# Patient Record
Sex: Male | Born: 1995 | Race: White | Hispanic: No | Marital: Single | Smoking: Former smoker
Health system: Southern US, Community
[De-identification: ages and names within clinical notes are randomized; demographics above are authoritative.]

## PROBLEM LIST (undated history)

## (undated) DIAGNOSIS — I10 Essential (primary) hypertension: Secondary | ICD-10-CM

## (undated) DIAGNOSIS — F419 Anxiety disorder, unspecified: Secondary | ICD-10-CM

## (undated) DIAGNOSIS — R55 Syncope and collapse: Secondary | ICD-10-CM

## (undated) HISTORY — PX: TONSILLECTOMY: SUR1361

---

## 2017-11-18 ENCOUNTER — Other Ambulatory Visit: Payer: Self-pay | Admitting: *Deleted

## 2017-11-18 ENCOUNTER — Ambulatory Visit
Admission: RE | Admit: 2017-11-18 | Discharge: 2017-11-18 | Disposition: A | Discharge status: Home / Self Care | Payer: No Typology Code available for payment source | Source: Ambulatory Visit | Attending: *Deleted | Admitting: *Deleted

## 2017-11-18 DIAGNOSIS — R7611 Nonspecific reaction to tuberculin skin test without active tuberculosis: Secondary | ICD-10-CM

## 2017-12-07 ENCOUNTER — Encounter: Payer: Self-pay | Admitting: Internal Medicine

## 2017-12-07 ENCOUNTER — Ambulatory Visit (INDEPENDENT_AMBULATORY_CARE_PROVIDER_SITE_OTHER): Payer: BLUE CROSS/BLUE SHIELD | Admitting: Internal Medicine

## 2017-12-07 VITALS — BP 106/67 | HR 92 | Temp 97.8°F | Wt 166.0 lb

## 2017-12-07 DIAGNOSIS — Z23 Encounter for immunization: Secondary | ICD-10-CM

## 2017-12-07 DIAGNOSIS — B182 Chronic viral hepatitis C: Secondary | ICD-10-CM | POA: Diagnosis not present

## 2017-12-07 LAB — COMPLETE METABOLIC PANEL WITH GFR
AG Ratio: 1.7 (calc) (ref 1.0–2.5)
ALT: 65 U/L — AB (ref 9–46)
AST: 38 U/L (ref 10–40)
Albumin: 4.3 g/dL (ref 3.6–5.1)
Alkaline phosphatase (APISO): 108 U/L (ref 40–115)
BUN: 14 mg/dL (ref 7–25)
CALCIUM: 10 mg/dL (ref 8.6–10.3)
CO2: 27 mmol/L (ref 20–32)
CREATININE: 0.84 mg/dL (ref 0.60–1.35)
Chloride: 104 mmol/L (ref 98–110)
GFR, EST AFRICAN AMERICAN: 144 mL/min/{1.73_m2} (ref >=60)
GFR, Est Non African American: 124 mL/min/{1.73_m2} (ref >=60)
Globulin: 2.6 g/dL (calc) (ref 1.9–3.7)
Glucose, Bld: 85 mg/dL (ref 65–99)
Potassium: 4.7 mmol/L (ref 3.5–5.3)
Sodium: 140 mmol/L (ref 135–146)
TOTAL PROTEIN: 6.9 g/dL (ref 6.1–8.1)
Total Bilirubin: 0.6 mg/dL (ref 0.2–1.2)

## 2017-12-07 NOTE — Progress Notes (Signed)
RFV: hep c treatment  Patient ID: Timothy Arnold, male   DOB: 1995-10-01, 22 y.o.   MRN: [id number]  HPI Timothy Arnold is a 21yo M who has chronic hep C without hepatic coma. He has history of heroin iv drug use but in the last year only did it a handful of times but predominantly smoked it, but has been committed to sober and no longer using. He is at a substance abuse program- 38 days of sobriety. He states that the brief time of injection drug use, he used his own needles and not thought to sharing other equipment. He also has had some homemade tattoos.  Just had a 2 wisdom extraction on upper left, taking ibuprofen for pain and finishing out course of penicillin  Previously worked in [employer] - rampant with drug use- he mentions  Labs show hep c VL 140,000 noted on 11/11/17 Outpatient Encounter Medications as of 12/07/2017  Medication Sig  . cloNIDine (CATAPRES) 0.1 MG tablet Take 0.1 mg by mouth.  Marland Kitchen rOPINIRole (REQUIP) 1 MG tablet TK 1 T PO  QD   No facility-administered encounter medications on file as of 12/07/2017.    - zoloft - seroquel - penicillin  There are no active problems to display for this patient.    Health Maintenance Due  Topic Date Due  . HIV Screening  01/30/2010  . TETANUS/TDAP  01/30/2014  . INFLUENZA VACCINE  04/14/2017    Social History   Tobacco Use  . Smoking status: Current Some Day Smoker    Packs/day: 1.00    Types: Cigarettes  . Smokeless tobacco: Never Used  Substance Use Topics  . Alcohol use: Not Currently  . Drug use: Not Currently    Types: Marijuana   Family hx = no family member with liver disease. Hx of HTN, CAD Review of Systems Review of Systems  Constitutional: Negative for fever, chills, diaphoresis, activity change, appetite change, fatigue and unexpected weight change.  HENT: Negative for congestion, sore throat, rhinorrhea, sneezing, trouble swallowing and sinus pressure.  Eyes: Negative for photophobia and visual  disturbance.  Respiratory: Negative for cough, chest tightness, shortness of breath, wheezing and stridor.  Cardiovascular: Negative for chest pain, palpitations and leg swelling.  Gastrointestinal: Negative for nausea, vomiting, abdominal pain, diarrhea, constipation, blood in stool, abdominal distention and anal bleeding.  Genitourinary: Negative for dysuria, hematuria, flank pain and difficulty urinating.  Musculoskeletal: Negative for myalgias, back pain, joint swelling, arthralgias and gait problem.  Skin: Negative for color change, pallor, rash and wound.  Neurological: Negative for dizziness, tremors, weakness and light-headedness.  Hematological: Negative for adenopathy. Does not bruise/bleed easily.  Psychiatric/Behavioral: Negative for behavioral problems, confusion, sleep disturbance, dysphoric mood, decreased concentration and agitation.    Physical Exam   BP 106/67   Pulse 92   Temp 97.8 F (36.6 C) (Oral)   Wt 166 lb (75.3 kg)   .Physical Exam  Constitutional: He is oriented to person, place, and time. He appears well-developed and well-nourished. No distress.  HENT:  Mouth/Throat: Oropharynx is clear and moist. No oropharyngeal exudate.  Cardiovascular: Normal rate, regular rhythm and normal heart sounds. Exam reveals no gallop and no friction rub.  No murmur heard.  Pulmonary/Chest: Effort normal and breath sounds normal. No respiratory distress. He has no wheezes.  Abdominal: Soft. Bowel sounds are normal. He exhibits no distension. There is no tenderness.  Lymphadenopathy:  He has no cervical adenopathy.  Neurological: He is alert and oriented to person, place, and time.  Skin:  Skin is warm and dry. No rash noted. No erythema.  Psychiatric: He has a normal mood and affect. His behavior is normal.     Assessment and Plan  Chronic hepatitis C without hepatic coma = will still need to do further testing and imaging to determine what antiviral therapy he will  need.in the meantime,   Will give hep A vaccine #1 today  Will check hep B S ab to see if he needs the vaccine series  Will check genotype, fibrosure, and RUQ u/s.  Will check hiv ab  Continue with substance abuse treatment program and counseling. Agree that moving away for crowd that used substance abuse would be helpful- in terms of harm reduction

## 2017-12-07 NOTE — Progress Notes (Signed)
HPI: Achilles DunkSteven Ramires is a 22 y.o. male who is here for his initial visit for his hep C.   No results found for: HCVGENOTYPE, HEPCGENOTYPE  Allergies: Not on File  Vitals: Temp: 97.8 F (36.6 C) (03/26 1540) Temp Source: Oral (03/26 1540) BP: 106/67 (03/26 1540) Pulse Rate: 92 (03/26 1540)  Past Medical History: No past medical history on file.  Social History: Social History   Socioeconomic History  . Marital status: Single    Spouse name: Not on file  . Number of children: Not on file  . Years of education: Not on file  . Highest education level: Not on file  Occupational History  . Not on file  Social Needs  . Financial resource strain: Not on file  . Food insecurity:    Worry: Not on file    Inability: Not on file  . Transportation needs:    Medical: Not on file    Non-medical: Not on file  Tobacco Use  . Smoking status: Current Some Day Smoker    Packs/day: 1.00    Types: Cigarettes  . Smokeless tobacco: Never Used  Substance and Sexual Activity  . Alcohol use: Not Currently  . Drug use: Not Currently    Types: Marijuana  . Sexual activity: Not on file  Lifestyle  . Physical activity:    Days per week: Not on file    Minutes per session: Not on file  . Stress: Not on file  Relationships  . Social connections:    Talks on phone: Not on file    Gets together: Not on file    Attends religious service: Not on file    Active member of club or organization: Not on file    Attends meetings of clubs or organizations: Not on file    Relationship status: Not on file  Other Topics Concern  . Not on file  Social History Narrative  . Not on file    Labs: No results found for: HIV1RNAQUANT, HIV1RNAVL, CD4TABS, HEPBSAB, HEPBSAG, HCVAB  No results found for: HCVGENOTYPE, HEPCGENOTYPE  No flowsheet data found.  No results found for: AST, ALT, INR  CrCl: CrCl cannot be calculated (No order found.).  Fibrosis Score: Pending  Child-Pugh  Score: Pending  Previous Treatment Regimen: None  Assessment: Viviann SpareSteven is here for his initial visit of his hep C. He got it from heroin use. He has been clean for about 35 days. Counseled him on the importance of adherence in order to achieve cure. He is on his stepdad's insurance but doesn't have the card today. He will email Kathie RhodesBetty with it. Counseled him on the potential choice that we have for therapy depending on his genotype.   Advised him to avoid ETOH to avoid damage to his liver. Since he is not on much, interaction is not an issue.   Recommendations:  Hep C labs today Treatment based on genotype US with elastography  Black & DeckerMinh Aidden Markovic, Pharm.D., BCPS, AAHIVP Clinical Infectious Disease Pharmacist [facility] 12/07/2017, 4:12 PM

## 2017-12-08 LAB — CBC WITH DIFFERENTIAL/PLATELET
BASOS PCT: 0.4 %
Basophils Absolute: 34 cells/uL (ref 0–200)
Eosinophils Absolute: 60 cells/uL (ref 15–500)
Eosinophils Relative: 0.7 %
HCT: 41.4 % (ref 38.5–50.0)
HEMOGLOBIN: 14.4 g/dL (ref 13.2–17.1)
Lymphs Abs: 2355 cells/uL (ref 850–3900)
MCH: 30.8 pg (ref 27.0–33.0)
MCHC: 34.8 g/dL (ref 32.0–36.0)
MCV: 88.5 fL (ref 80.0–100.0)
MONOS PCT: 9.5 %
MPV: 9.9 fL (ref 7.5–12.5)
NEUTROS ABS: 5245 {cells}/uL (ref 1500–7800)
Neutrophils Relative %: 61.7 %
PLATELETS: 342 10*3/uL (ref 140–400)
RBC: 4.68 10*6/uL (ref 4.20–5.80)
RDW: 11.5 % (ref 11.0–15.0)
TOTAL LYMPHOCYTE: 27.7 %
WBC: 8.5 10*3/uL (ref 3.8–10.8)
WBCMIX: 808 {cells}/uL (ref 200–950)

## 2017-12-08 LAB — HEPATITIS B SURFACE ANTIGEN: HEP B S AG: NONREACTIVE

## 2017-12-08 LAB — HEPATITIS A ANTIBODY, TOTAL: Hepatitis A AB,Total: NONREACTIVE

## 2017-12-08 LAB — HIV ANTIBODY (ROUTINE TESTING W REFLEX): HIV 1&2 Ab, 4th Generation: NONREACTIVE

## 2017-12-08 LAB — HEPATITIS B SURFACE ANTIBODY,QUALITATIVE: HEP B S AB: REACTIVE — AB

## 2017-12-08 LAB — PROTIME-INR
INR: 0.9
PROTHROMBIN TIME: 9.8 s (ref 9.0–11.5)

## 2017-12-10 LAB — LIVER FIBROSIS, FIBROTEST-ACTITEST
ALPHA-2-MACROGLOBULIN: 222 mg/dL (ref 106–279)
ALT: 72 U/L — AB (ref 9–46)
APOLIPOPROTEIN A1: 115 mg/dL (ref 94–176)
Bilirubin: 0.4 mg/dL (ref 0.2–1.2)
Fibrosis Score: 0.2
GGT: 51 U/L (ref 3–70)
Haptoglobin: 129 mg/dL (ref 43–212)
Necroinflammat ACT Score: 0.39
Reference ID: 2397499

## 2017-12-10 LAB — HEPATITIS C GENOTYPE

## 2018-01-11 ENCOUNTER — Telehealth: Payer: Self-pay | Admitting: *Deleted

## 2018-01-11 NOTE — Telephone Encounter (Signed)
Patient and his Case manager Laurel Dimmer called to inquire about the elastrography ordered at his visit. They advised they have called several times and gotten no response or call back. Advised scheduler is not available but will pass the message. Was advised to call Laurel Dimmer with the appt as the patient is in therapy at her facility and they will arrange to have him here. Given the ok to speak with Ms Elenore Paddy by the patient. Her contact number is below. Advised will make sure she gets the message and once it is schedule someone will give them a call. Advised we have to get approval from the insurance company.  Laurel Dimmer [phone]

## 2018-01-19 ENCOUNTER — Ambulatory Visit: Payer: Self-pay | Admitting: Infectious Disease

## 2018-01-26 ENCOUNTER — Ambulatory Visit (HOSPITAL_COMMUNITY)
Admission: RE | Admit: 2018-01-26 | Discharge: 2018-01-26 | Disposition: A | Discharge status: Home / Self Care | Payer: BLUE CROSS/BLUE SHIELD | Source: Ambulatory Visit | Attending: Internal Medicine | Admitting: Internal Medicine

## 2018-01-26 DIAGNOSIS — B182 Chronic viral hepatitis C: Secondary | ICD-10-CM | POA: Insufficient documentation

## 2018-02-03 ENCOUNTER — Telehealth: Payer: Self-pay | Admitting: Pharmacist Clinician (PhC)/ Clinical Pharmacy Specialist

## 2018-02-03 NOTE — Telephone Encounter (Signed)
All of his labs and [place] results are back. Left him a VM to come back

## 2018-08-01 ENCOUNTER — Emergency Department (HOSPITAL_COMMUNITY)
Admission: EM | Admit: 2018-08-01 | Discharge: 2018-08-01 | Disposition: A | Discharge status: Home / Self Care | Payer: BLUE CROSS/BLUE SHIELD | Attending: Emergency Medicine | Admitting: Emergency Medicine

## 2018-08-01 ENCOUNTER — Other Ambulatory Visit: Payer: Self-pay

## 2018-08-01 ENCOUNTER — Encounter (HOSPITAL_COMMUNITY): Payer: Self-pay

## 2018-08-01 DIAGNOSIS — R55 Syncope and collapse: Secondary | ICD-10-CM | POA: Diagnosis not present

## 2018-08-01 DIAGNOSIS — Z79899 Other long term (current) drug therapy: Secondary | ICD-10-CM | POA: Diagnosis not present

## 2018-08-01 DIAGNOSIS — E860 Dehydration: Secondary | ICD-10-CM | POA: Insufficient documentation

## 2018-08-01 DIAGNOSIS — R112 Nausea with vomiting, unspecified: Secondary | ICD-10-CM | POA: Diagnosis present

## 2018-08-01 DIAGNOSIS — I10 Essential (primary) hypertension: Secondary | ICD-10-CM | POA: Insufficient documentation

## 2018-08-01 DIAGNOSIS — Z87891 Personal history of nicotine dependence: Secondary | ICD-10-CM | POA: Insufficient documentation

## 2018-08-01 HISTORY — DX: Essential (primary) hypertension: I10

## 2018-08-01 LAB — COMPREHENSIVE METABOLIC PANEL
ALT: 35 U/L (ref 0–44)
ANION GAP: 8 (ref 5–15)
AST: 32 U/L (ref 15–41)
Albumin: 4.6 g/dL (ref 3.5–5.0)
Alkaline Phosphatase: 70 U/L (ref 38–126)
BUN: 13 mg/dL (ref 6–20)
CALCIUM: 9.6 mg/dL (ref 8.9–10.3)
CO2: 26 mmol/L (ref 22–32)
CREATININE: 1.09 mg/dL (ref 0.61–1.24)
Chloride: 102 mmol/L (ref 98–111)
Glucose, Bld: 95 mg/dL (ref 70–99)
Potassium: 3.6 mmol/L (ref 3.5–5.1)
SODIUM: 136 mmol/L (ref 135–145)
Total Bilirubin: 1.5 mg/dL — ABNORMAL HIGH (ref 0.3–1.2)
Total Protein: 7.1 g/dL (ref 6.5–8.1)

## 2018-08-01 LAB — CBC WITH DIFFERENTIAL/PLATELET
Abs Immature Granulocytes: 0.02 10*3/uL (ref 0.00–0.07)
BASOS PCT: 0 %
Basophils Absolute: 0 10*3/uL (ref 0.0–0.1)
EOS ABS: 0 10*3/uL (ref 0.0–0.5)
EOS PCT: 1 %
HCT: 42.8 % (ref 39.0–52.0)
Hemoglobin: 14.8 g/dL (ref 13.0–17.0)
Immature Granulocytes: 0 %
LYMPHS PCT: 33 %
Lymphs Abs: 2.4 10*3/uL (ref 0.7–4.0)
MCH: 29.5 pg (ref 26.0–34.0)
MCHC: 34.6 g/dL (ref 30.0–36.0)
MCV: 85.3 fL (ref 80.0–100.0)
Monocytes Absolute: 0.8 10*3/uL (ref 0.1–1.0)
Monocytes Relative: 11 %
NRBC: 0 % (ref 0.0–0.2)
Neutro Abs: 3.9 10*3/uL (ref 1.7–7.7)
Neutrophils Relative %: 55 %
PLATELETS: 322 10*3/uL (ref 150–400)
RBC: 5.02 MIL/uL (ref 4.22–5.81)
RDW: 11.3 % — AB (ref 11.5–15.5)
WBC: 7.2 10*3/uL (ref 4.0–10.5)

## 2018-08-01 LAB — LIPASE, BLOOD: LIPASE: 31 U/L (ref 11–51)

## 2018-08-01 LAB — TROPONIN I: Troponin I: <0.03 ng/mL (ref <0.03)

## 2018-08-01 LAB — MAGNESIUM: MAGNESIUM: 1.8 mg/dL (ref 1.7–2.4)

## 2018-08-01 MED ORDER — ONDANSETRON HCL 4 MG PO TABS: 4.0000 mg | ORAL_TABLET | Freq: Three times a day (TID) | ORAL | 0 refills | Status: DC | PRN

## 2018-08-01 MED ORDER — ONDANSETRON HCL 4 MG/2ML IJ SOLN
4.0000 mg | Freq: Once | INTRAMUSCULAR | Status: AC
  Administered 2018-08-01: 4 mg via INTRAVENOUS
  Filled 2018-08-01: qty 2

## 2018-08-01 MED ORDER — SODIUM CHLORIDE 0.9 % IV BOLUS
1000.0000 mL | Freq: Once | INTRAVENOUS | Status: AC
  Administered 2018-08-01: 1000 mL via INTRAVENOUS

## 2018-08-01 NOTE — ED Provider Notes (Signed)
[facility]   CSN: [id number] Arrival date & time: 08/01/18  1129     History   Chief Complaint Chief Complaint  Patient presents with  . Emesis    HPI Timothy Arnold is a 22 y.o. male.  He is a history of hep C untreated and a history of substance abuse.  He follows with a mental health clinic for some medications but does not have a primary care doctor.  York SpanielSaid he has been sick for about 2 months with intermittent nausea and vomiting and panic attacks that caused him to have syncopal events.  Over the past 2 weeks its gotten worse.  He feels very dehydrated.  He smokes pot but he says not very much.  He denies other drugs.  No fevers no chills no chest pain or shortness of breath.  The history is provided by the patient.  Emesis   This is a new problem. Episode onset: 2 months. The problem occurs 2 to 4 times per day. The problem has not changed since onset.The emesis has an appearance of stomach contents. There has been no fever. Pertinent negatives include no abdominal pain, no chills, no cough, no diarrhea, no fever, no sweats and no URI.    Past Medical History:  Diagnosis Date  . Hypertension     There are no active problems to display for this patient.   Past Surgical History:  Procedure Laterality Date  . TONSILLECTOMY          Home Medications    Prior to Admission medications   Medication Sig Start Date End Date Taking? Authorizing Provider  cloNIDine (CATAPRES) 0.1 MG tablet Take 0.1 mg by mouth.    [provider]  rOPINIRole (REQUIP) 1 MG tablet TK 1 T PO  QD 10/14/17   [provider]    Family History No family history on file.  Social History Social History   Tobacco Use  . Smoking status: Former Smoker    Packs/day: 1.00    Types: Cigarettes    Last attempt to quit: 01/29/2018    Years since quitting: 0.5  . Smokeless tobacco: Never Used  Substance Use Topics  . Alcohol  use: Yes    Comment: social   . Drug use: Yes    Types: Marijuana     Allergies   Patient has no known allergies.   Review of Systems Review of Systems  Constitutional: Negative for chills and fever.  HENT: Negative for sore throat.   Eyes: Negative for visual disturbance.  Respiratory: Negative for cough and shortness of breath.   Cardiovascular: Negative for chest pain.  Gastrointestinal: Positive for nausea and vomiting. Negative for abdominal pain and diarrhea.  Genitourinary: Negative for dysuria.  Musculoskeletal: Negative for neck pain.  Skin: Negative for rash.  Neurological: Positive for syncope.  Psychiatric/Behavioral: The patient is nervous/anxious.      Physical Exam Updated Vital Signs BP 116/87   Pulse 79   Temp 98.3 F (36.8 C) (Oral)   Resp 10   Ht 6\' 2"  (1.88 m)   Wt 72.6 kg   SpO2 97%   BMI 20.54 kg/m   Physical Exam  Constitutional: He is oriented to person, place, and time. He appears well-developed and well-nourished.  HENT:  Head: Normocephalic and atraumatic.  Eyes: Conjunctivae are normal.  Neck: Neck supple.  Cardiovascular: Normal rate and regular rhythm.  No murmur heard. Pulmonary/Chest: Effort normal and breath sounds normal. No respiratory  distress.  Abdominal: Soft. There is no tenderness.  Musculoskeletal: He exhibits no edema, tenderness or deformity.  Neurological: He is alert and oriented to person, place, and time. He has normal strength. No cranial nerve deficit or sensory deficit. GCS eye subscore is 4. GCS verbal subscore is 5. GCS motor subscore is 6.  Skin: Skin is warm and dry.  Psychiatric: He has a normal mood and affect.  Nursing note and vitals reviewed.    ED Treatments / Results  Labs (all labs ordered are listed, but only abnormal results are displayed) Labs Reviewed  COMPREHENSIVE METABOLIC PANEL - Abnormal; Notable for the following components:      Result Value   Total Bilirubin 1.5 (*)    All other  components within normal limits  CBC WITH DIFFERENTIAL/PLATELET - Abnormal; Notable for the following components:   RDW 11.3 (*)    All other components within normal limits  LIPASE, BLOOD  MAGNESIUM  TROPONIN I    EKG EKG Interpretation  Date/Time:  Monday August 01 2018 12:26:04 EST Ventricular Rate:  72 PR Interval:    QRS Duration: 100 QT Interval:  381 QTC Calculation: 417 R Axis:   93 Text Interpretation:  Sinus rhythm LAE, consider biatrial enlargement Borderline right axis deviation RSR' in V1 or V2, probably normal variant ST elevation suggests acute pericarditis no prior to compare with Confirmed by Meridee Score [phone]) on 08/01/2018 12:28:51 PM Also confirmed by Meridee Score [phone]), editor Sheppard Evens ([id number])  on 08/01/2018 4:19:19 PM   Radiology No results found.  Procedures Procedures (including critical care time)  Medications Ordered in ED Medications  sodium chloride 0.9 % bolus 1,000 mL (has no administration in time range)  ondansetron (ZOFRAN) injection 4 mg (has no administration in time range)     Initial Impression / Assessment and Plan / ED Course  I have reviewed the triage vital signs and the nursing notes.  Pertinent labs & imaging results that were available during my care of the patient were reviewed by me and considered in my medical decision making (see chart for details).   labs unremarkable. Will treat with zofran, have followup with pcp clinic for further workup.   Final Clinical Impressions(s) / ED Diagnoses   Final diagnoses:  Dehydration  Syncope, unspecified syncope type    ED Discharge Orders         Ordered    ondansetron (ZOFRAN) 4 MG tablet  Every 8 hours PRN     08/01/18 1511           Terrilee Files, MD 08/01/18 Rickey Primus

## 2018-08-01 NOTE — ED Notes (Signed)
Patient verbalizes understanding of discharge instructions. Opportunity for questioning and answers were provided. Armband removed by staff, pt discharged from ED ambulatory.   

## 2018-08-01 NOTE — ED Triage Notes (Signed)
Pt states that he has had NV and panic attacks over the past 2 months, and fainting with standing, reports weight loss.

## 2018-08-01 NOTE — Discharge Instructions (Addendum)
You were evaluated in the emergency department for nausea vomiting and fainting spells.  You had blood work and an EKG that did not show an obvious cause of your symptoms.  We are prescribing some nausea medication which may help you keep better hydrated.  Will be important for you to get a primary care doctor so you can have a further work-up of this.  Please return if any worsening symptoms.

## 2018-10-21 ENCOUNTER — Telehealth: Payer: Self-pay | Admitting: Pharmacist

## 2018-10-21 NOTE — Telephone Encounter (Signed)
Patient saw Dr. Drue Second back in March 2019 for his chronic Hepatitis C infection. I called him today to see if he was still interested in treatment and he is.  Made him a f/u appointment with me for 2/17 at 4pm so he can come in and get updated labs and discuss treatment.

## 2018-10-31 ENCOUNTER — Ambulatory Visit: Payer: BLUE CROSS/BLUE SHIELD | Admitting: Pharmacist

## 2018-11-01 NOTE — Telephone Encounter (Signed)
Patient no showed appointment yesterday to discuss Hepatitis C. Willing to reschedule and discuss with him if he calls and is interested.

## 2019-05-30 ENCOUNTER — Other Ambulatory Visit: Payer: Self-pay

## 2019-05-30 ENCOUNTER — Emergency Department (HOSPITAL_COMMUNITY)
Admission: EM | Admit: 2019-05-30 | Discharge: 2019-05-30 | Disposition: A | Discharge status: Home / Self Care | Payer: BC Managed Care – PPO | Attending: Emergency Medicine | Admitting: Emergency Medicine

## 2019-05-30 ENCOUNTER — Encounter (HOSPITAL_COMMUNITY): Payer: Self-pay | Admitting: *Deleted

## 2019-05-30 DIAGNOSIS — Z87891 Personal history of nicotine dependence: Secondary | ICD-10-CM | POA: Insufficient documentation

## 2019-05-30 DIAGNOSIS — K5900 Constipation, unspecified: Secondary | ICD-10-CM | POA: Insufficient documentation

## 2019-05-30 NOTE — Discharge Instructions (Addendum)
Please read attached information. If you experience any new or worsening signs or symptoms please return to the emergency room for evaluation. Please follow-up with your primary care provider or specialist as discussed.  Please use MiraLAX as directed.  °

## 2019-05-30 NOTE — ED Triage Notes (Signed)
Pt in c/o chronic constipation with the hx of the same, pt reports LBM x 4 days ago, pt states, "I stuck my finger up there and it is too big to come out. I have had to do it before and it will come out and bleed a little, but I can't get it out now. I am pretty sure it is because of the medicine I take." pt denies n/v/d, pt c/o rectal pressure and fullness, A&O x4

## 2019-05-30 NOTE — ED Provider Notes (Signed)
[facility]   CSN: [id number] Arrival date & time: 05/30/19  1356     History   Chief Complaint Chief Complaint  Patient presents with  . Constipation    HPI Achilles DunkSteven Pogorzelski is a 23 y.o. male.     HPI    23 year old male presents today with complaints of constipation.  Patient notes a history of chronic constipation that is intermittent.  Usually he is able to manage this at home.  He notes he feels like he has a large burden of stool in his rectum, he notes he was able to get some of it out with his finger but still feels fairly constipated.  Patient notes he went to urgent care and was referred to the emergency room.  He notes he is used numerous therapies at home including stool softener and laxatives with no significant improvement in symptoms.  No history of abdominal surgery.  He denies any fever.  Patient notes he takes Suboxone which contributes to his constipation.    Past Medical History:  Diagnosis Date  . Hypertension     There are no active problems to display for this patient.   Past Surgical History:  Procedure Laterality Date  . TONSILLECTOMY          Home Medications    Prior to Admission medications   Medication Sig Start Date End Date Taking? Authorizing Provider  cloNIDine (CATAPRES) 0.1 MG tablet Take 0.1 mg by mouth.    [provider]  ondansetron (ZOFRAN) 4 MG tablet Take 1 tablet (4 mg total) by mouth every 8 (eight) hours as needed for nausea or vomiting. 08/01/18   Terrilee FilesButler, Michael C, MD  rOPINIRole (REQUIP) 1 MG tablet TK 1 T PO  QD 10/14/17   [provider]    Family History No family history on file.  Social History Social History   Tobacco Use  . Smoking status: Former Smoker    Packs/day: 1.00    Types: Cigarettes    Quit date: 01/29/2018    Years since quitting: 1.3  . Smokeless tobacco: Never Used  Substance Use Topics  . Alcohol use: Yes   Comment: social   . Drug use: Yes    Types: Marijuana     Allergies   Patient has no known allergies.   Review of Systems Review of Systems  All other systems reviewed and are negative.    Physical Exam Updated Vital Signs BP 100/68   Pulse 69   Temp 98.3 F (36.8 C) (Oral)   Resp 16   Ht 6\' 3"  (1.905 m)   Wt 68 kg   SpO2 99%   BMI 18.75 kg/m   Physical Exam Vitals signs and nursing note reviewed.  Constitutional:      Appearance: He is well-developed.  HENT:     Head: Normocephalic and atraumatic.  Eyes:     General: No scleral icterus.       Right eye: No discharge.        Left eye: No discharge.     Conjunctiva/sclera: Conjunctivae normal.     Pupils: Pupils are equal, round, and reactive to light.  Neck:     Musculoskeletal: Normal range of motion.     Vascular: No JVD.     Trachea: No tracheal deviation.  Pulmonary:     Effort: Pulmonary effort is normal.     Breath sounds: No stridor.  Abdominal:     General: There is  no distension.     Palpations: Abdomen is soft.     Tenderness: There is no abdominal tenderness.  Genitourinary:    Comments: Large burden of hard stool noted in the rectum Neurological:     Mental Status: He is alert and oriented to person, place, and time.     Coordination: Coordination normal.  Psychiatric:        Behavior: Behavior normal.        Thought Content: Thought content normal.        Judgment: Judgment normal.      ED Treatments / Results  Labs (all labs ordered are listed, but only abnormal results are displayed) Labs Reviewed - No data to display  EKG None  Radiology No results found.  Procedures Procedures (including critical care time)  Medications Ordered in ED Medications - No data to display   Initial Impression / Assessment and Plan / ED Course  I have reviewed the triage vital signs and the nursing notes.  Pertinent labs & imaging results that were available during my care of the patient  were reviewed by me and considered in my medical decision making (see chart for details).        Labs:   Imaging:  Consults:  Therapeutics:  Discharge Meds:   Assessment/Plan:   23 year old male presents today with constipation.  He was manually disimpacted here, he was able to have a normal bowel movement subsequently.  He has no abdominal tenderness to palpation, is well-appearing no acute distress.  Discharged with instructions to use MiraLAX high-fiber diet drink plenty fluids and return if he develops any concerning signs or symptoms.  He verbalized understanding and agreement to today's plan.     Final Clinical Impressions(s) / ED Diagnoses   Final diagnoses:  Constipation, unspecified constipation type    ED Discharge Orders    None       Francee Gentile 05/31/19 1033    Quintella Reichert, MD 05/31/19 1127

## 2019-07-18 IMAGING — US US ABDOMEN COMPLETE W/ ELASTOGRAPHY
1 series · 13 of 13 positions shown · non-contrast
Comparison: None

CLINICAL DATA: Chronic hepatitis-C without hepatic coma



[Series 1: us abdomen complete w/ elastography · 0.14mm/px · 13 of 13 slices shown]
[im 1/13]
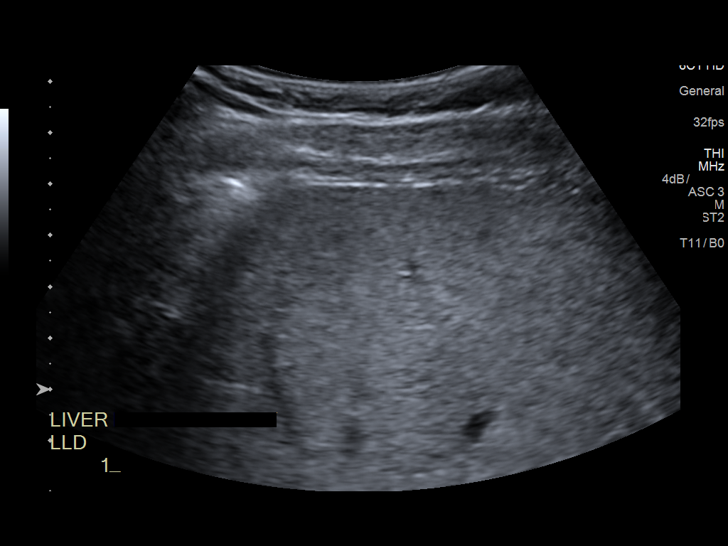
[im 2/13]
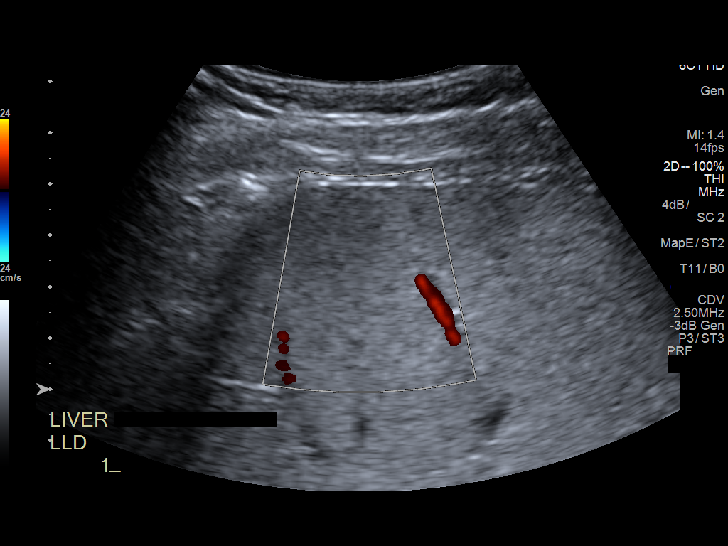
[im 3/13]
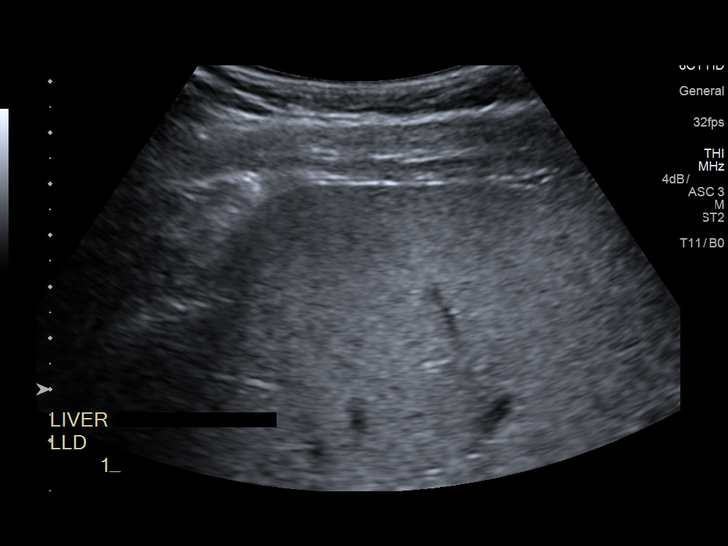
[im 4/13]
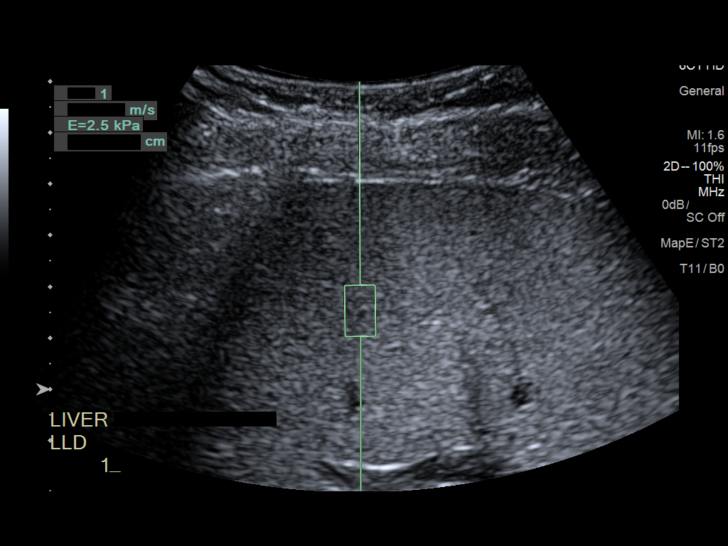
[im 5/13]
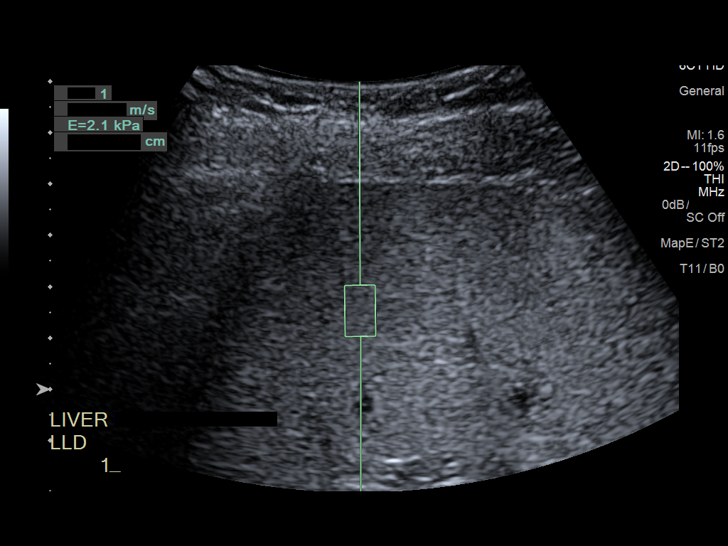
[im 6/13]
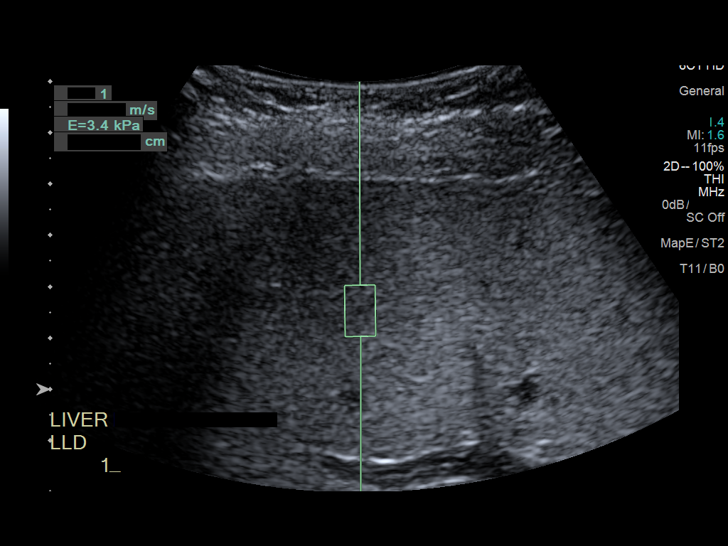
[im 7/13]
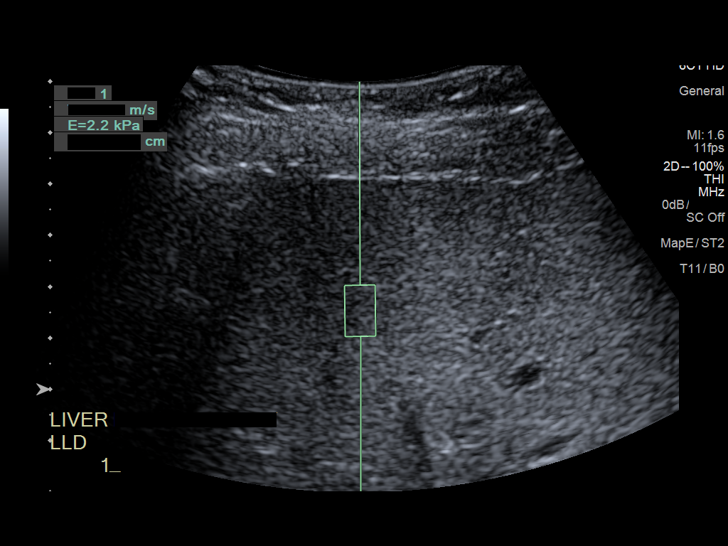
[im 8/13]
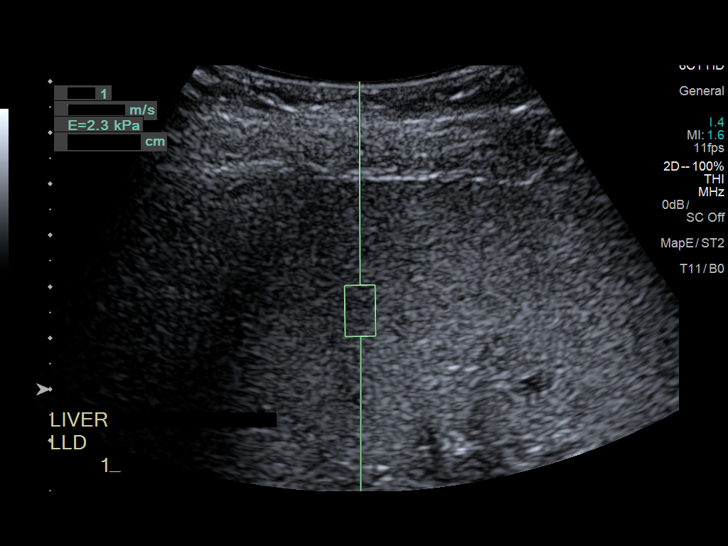
[im 9/13]
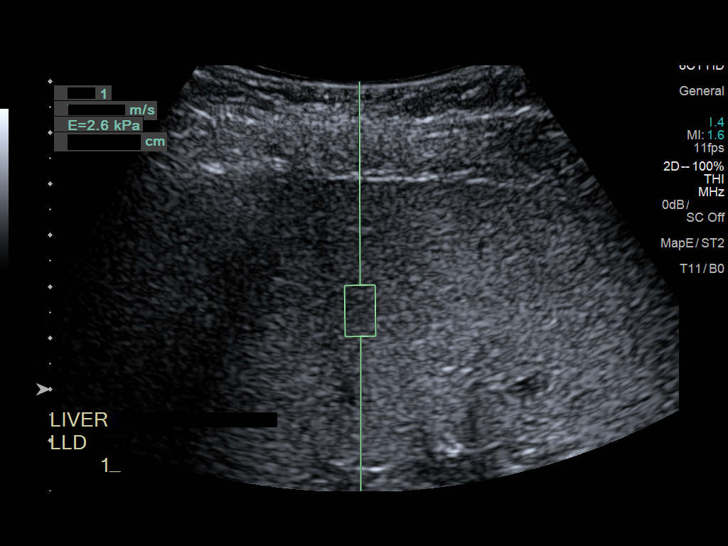
[im 10/13]
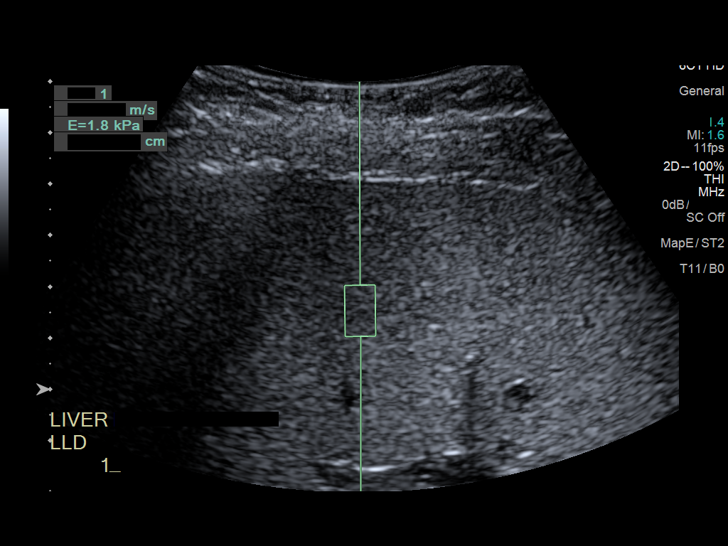
[im 11/13]
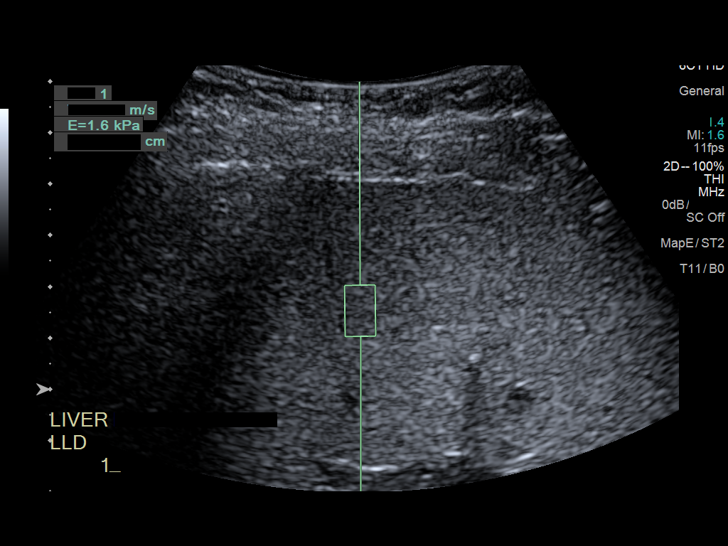
[im 12/13]
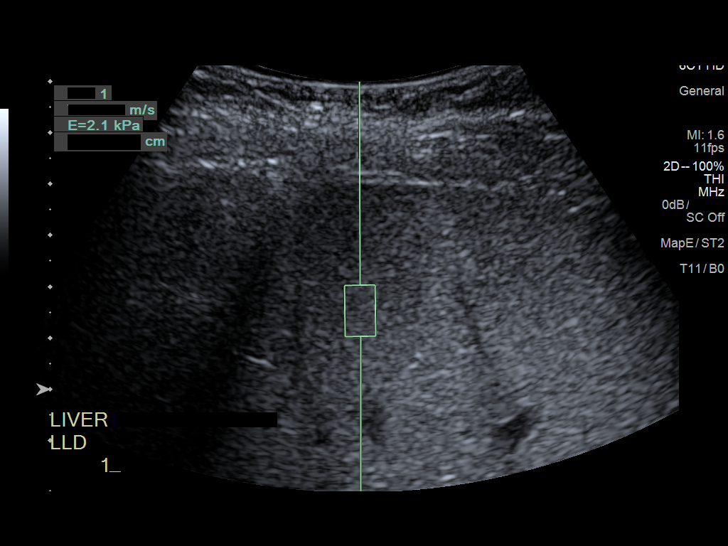
[im 13/13]
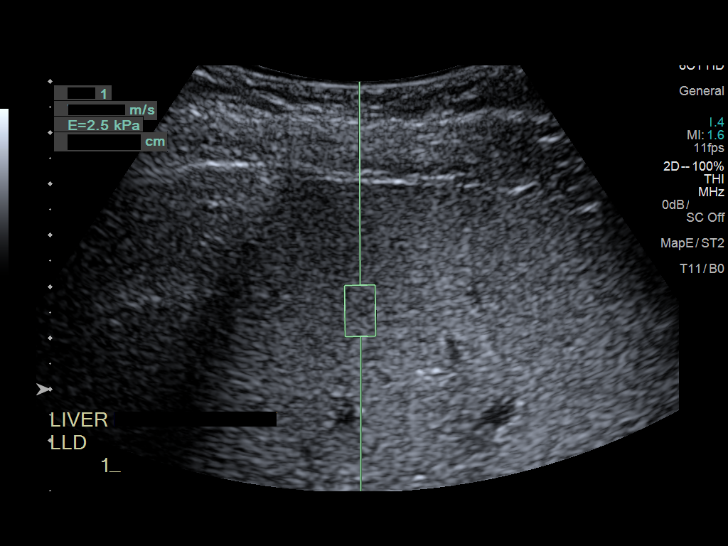

[13 of 13 positions shown; findings below may reference images not displayed]

FINDINGS: ULTRASOUND ABDOMEN

Gallbladder: Normally distended without stones or wall thickening.
No pericholecystic fluid or sonographic Murphy sign.

Common bile duct: Diameter: 6 mm diameter, upper normal

Liver: Borderline increased echogenicity. No focal hepatic mass or
nodularity. Portal vein is patent on color Doppler imaging with
normal direction of blood flow towards the liver.

IVC: Normal appearance

Pancreas: Normal appearance

Spleen: Normal appearance, 10.0 cm length

Right Kidney: Length: 10.1 cm. Question 3 mm nonobstructing mid
RIGHT renal calculus. No hydronephrosis or renal mass.

Left Kidney: Length: 11.2 cm. Normal morphology without mass or
hydronephrosis.

Abdominal aorta: Normal caliber

Other findings: No free fluid

ULTRASOUND HEPATIC ELASTOGRAPHY

Device: Siemens Helix VTQ

Patient position: Left Lateral Decubitus

Transducer 6C1

Number of measurements: 10

Hepatic segment:  8

Median velocity:   0.87 m/sec

IQR:

IQR/Median velocity ratio:

Corresponding Metavir fibrosis score:  F0/F1

Risk of fibrosis: Low

Limitations of exam: None

Please note that abnormal shear wave velocities may also be
identified in clinical settings other than with hepatic fibrosis,
such as: acute hepatitis, elevated right heart and central venous
pressures including use of beta blockers, Viray disease
([place]), infiltrative processes such as
mastocytosis/amyloidosis/infiltrative tumor, extrahepatic
cholestasis, in the post-prandial state, and liver transplantation.
Correlation with patient history, laboratory data, and clinical
condition recommended.
IMPRESSION: ULTRASOUND ABDOMEN:
Questionable tiny nonobstructing RIGHT renal calculus.

No other definite upper abdominal sonographic abnormalities.

ULTRASOUND HEPATIC ELASTOGRAPHY:

Median hepatic shear wave velocity is calculated at 0.87 m/sec.

Corresponding Metavir fibrosis score is F0/F1.

Risk of fibrosis is low.

Follow-up: None required

## 2020-02-12 ENCOUNTER — Emergency Department (HOSPITAL_COMMUNITY)
Admission: EM | Admit: 2020-02-12 | Discharge: 2020-02-12 | Disposition: A | Discharge status: Home / Self Care | Payer: BC Managed Care – PPO | Attending: Emergency Medicine | Admitting: Emergency Medicine

## 2020-02-12 ENCOUNTER — Other Ambulatory Visit: Payer: Self-pay

## 2020-02-12 DIAGNOSIS — K0889 Other specified disorders of teeth and supporting structures: Secondary | ICD-10-CM | POA: Diagnosis not present

## 2020-02-12 DIAGNOSIS — I10 Essential (primary) hypertension: Secondary | ICD-10-CM | POA: Insufficient documentation

## 2020-02-12 DIAGNOSIS — Z87891 Personal history of nicotine dependence: Secondary | ICD-10-CM | POA: Insufficient documentation

## 2020-02-12 DIAGNOSIS — Z79899 Other long term (current) drug therapy: Secondary | ICD-10-CM | POA: Diagnosis not present

## 2020-02-12 MED ORDER — KETOROLAC TROMETHAMINE 30 MG/ML IJ SOLN
30.0000 mg | Freq: Once | INTRAMUSCULAR | Status: AC
  Administered 2020-02-12: 30 mg via INTRAMUSCULAR
  Filled 2020-02-12: qty 1

## 2020-02-12 NOTE — Discharge Instructions (Signed)
Follow up with your dentist tomorrow for further management of your recurrent dental pain

## 2020-02-12 NOTE — ED Triage Notes (Signed)
Patient reports he was seen about a week and a half ago for dental pain and was given a "pain shot" that has gotten him to today. Patient reports he has an apt with the dentist tomorrow and just needs another shot to get him through to his apt.

## 2020-02-12 NOTE — ED Provider Notes (Signed)
[facility]-EMERGENCY DEPT Provider Note   CSN: [id number] Arrival date & time: 02/12/20  1235     History Chief Complaint  Patient presents with  . Dental Pain    Timothy Arnold is a 24 y.o. male.  The history is provided by the patient and medical records. No language interpreter was used.     24 year old male presenting for evaluation of dental pain.  Patient report for the past 2 months he has had recurrent dental pain.  Pain is involving the molars on both sides.  Pain primarily in the lower molars.  Pain is sharp throbbing achy with chewing.  Denies heat or cold sensitivity.  No fever, no trouble swallowing, no no neck pain chest pain or trouble breathing.  He has been seen for his dental pain recently and was prescribed amoxicillin that he recently finished.  He takes ibuprofen for pain without adequate relief.  He does have an appointment with a dentist tomorrow but he is here today requesting for immediate pain control.  Past Medical History:  Diagnosis Date  . Hypertension     There are no problems to display for this patient.   Past Surgical History:  Procedure Laterality Date  . TONSILLECTOMY         No family history on file.  Social History   Tobacco Use  . Smoking status: Former Smoker    Packs/day: 1.00    Types: Cigarettes    Quit date: 01/29/2018    Years since quitting: 2.0  . Smokeless tobacco: Never Used  Substance Use Topics  . Alcohol use: Yes    Comment: social   . Drug use: Yes    Types: Marijuana    Home Medications Prior to Admission medications   Medication Sig Start Date End Date Taking? Authorizing Provider  cloNIDine (CATAPRES) 0.1 MG tablet Take 0.1 mg by mouth.    [provider]  ondansetron (ZOFRAN) 4 MG tablet Take 1 tablet (4 mg total) by mouth every 8 (eight) hours as needed for nausea or vomiting. 08/01/18   Terrilee Files, MD  rOPINIRole (REQUIP) 1 MG tablet TK 1 T PO  QD 10/14/17    [provider]    Allergies    Patient has no known allergies.  Review of Systems   Review of Systems  Constitutional: Negative for fever.  HENT: Positive for dental problem.     Physical Exam Updated Vital Signs There were no vitals taken for this visit.  Physical Exam Vitals and nursing note reviewed.  Constitutional:      General: He is not in acute distress.    Appearance: He is well-developed.  HENT:     Head: Atraumatic.     Mouth/Throat:     Comments: Decay noted to tooth #32 as well as tooth 17 and 18.  Several amalgam fillings noted to several other teeth.  Tenderness to palpation to affected teeth but no obvious abscess appreciated.  No malocclusion.  No signs of mastoiditis.  Ear exam unremarkable. Eyes:     Conjunctiva/sclera: Conjunctivae normal.  Musculoskeletal:     Cervical back: Neck supple.  Skin:    Findings: No rash.  Neurological:     Mental Status: He is alert.     ED Results / Procedures / Treatments   Labs (all labs ordered are listed, but only abnormal results are displayed) Labs Reviewed - No data to display  EKG None  Radiology No results found.  Procedures Procedures (including  critical care time)  Medications Ordered in ED Medications - No data to display  ED Course  I have reviewed the triage vital signs and the nursing notes.  Pertinent labs & imaging results that were available during my care of the patient were reviewed by me and considered in my medical decision making (see chart for details).    MDM Rules/Calculators/A&P                      BP (!) 129/98 (BP Location: Right Arm)   Pulse 68   Temp 98.8 F (37.1 C) (Oral)   Resp 18   SpO2 100%   Final Clinical Impression(s) / ED Diagnoses Final diagnoses:  Pain, dental    Rx / DC Orders ED Discharge Orders    None     1:27 PM Patient here with recurrent dental pain for the past 2 months.  Does have a dental appointment tomorrow.  Here  requesting for pain control.  He is currently on amoxicillin.  No obvious signs of dental abscess appreciated on exam.  Low suspicion for malocclusion, mastoiditis, or other deep tissue infection.  Will provide Toradol IM injection and encourage patient to follow-up with dentist tomorrow for further care.   Domenic Moras, PA-C 02/12/20 1455    Maudie Flakes, MD 02/19/20 1255

## 2023-01-09 ENCOUNTER — Emergency Department (HOSPITAL_COMMUNITY): Payer: BC Managed Care – PPO

## 2023-01-09 ENCOUNTER — Other Ambulatory Visit: Payer: Self-pay

## 2023-01-09 ENCOUNTER — Encounter (HOSPITAL_COMMUNITY): Payer: Self-pay

## 2023-01-09 ENCOUNTER — Emergency Department (HOSPITAL_COMMUNITY)
Admission: EM | Admit: 2023-01-09 | Discharge: 2023-01-09 | Disposition: A | Discharge status: Home / Self Care | Payer: BC Managed Care – PPO | Attending: Emergency Medicine | Admitting: Emergency Medicine

## 2023-01-09 DIAGNOSIS — F419 Anxiety disorder, unspecified: Secondary | ICD-10-CM | POA: Insufficient documentation

## 2023-01-09 DIAGNOSIS — R079 Chest pain, unspecified: Secondary | ICD-10-CM | POA: Insufficient documentation

## 2023-01-09 DIAGNOSIS — R002 Palpitations: Secondary | ICD-10-CM

## 2023-01-09 HISTORY — DX: Syncope and collapse: R55

## 2023-01-09 LAB — CK: Total CK: 72 U/L (ref 49–397)

## 2023-01-09 LAB — RAPID URINE DRUG SCREEN, HOSP PERFORMED
Amphetamines: NOT DETECTED
Barbiturates: NOT DETECTED
Benzodiazepines: NOT DETECTED
Cocaine: NOT DETECTED
Opiates: NOT DETECTED
Tetrahydrocannabinol: POSITIVE — AB

## 2023-01-09 LAB — URINALYSIS, ROUTINE W REFLEX MICROSCOPIC
Bilirubin Urine: NEGATIVE
Glucose, UA: NEGATIVE mg/dL
Hgb urine dipstick: NEGATIVE
Ketones, ur: NEGATIVE mg/dL
Leukocytes,Ua: NEGATIVE
Nitrite: NEGATIVE
Protein, ur: NEGATIVE mg/dL
Specific Gravity, Urine: 1.006 (ref 1.005–1.030)
pH: 6 (ref 5.0–8.0)

## 2023-01-09 LAB — HEPATIC FUNCTION PANEL
ALT: 45 U/L — ABNORMAL HIGH (ref 0–44)
AST: 31 U/L (ref 15–41)
Albumin: 3.8 g/dL (ref 3.5–5.0)
Alkaline Phosphatase: 43 U/L (ref 38–126)
Bilirubin, Direct: 0.2 mg/dL (ref 0.0–0.2)
Indirect Bilirubin: 1.3 mg/dL — ABNORMAL HIGH (ref 0.3–0.9)
Total Bilirubin: 1.5 mg/dL — ABNORMAL HIGH (ref 0.3–1.2)
Total Protein: 6.4 g/dL — ABNORMAL LOW (ref 6.5–8.1)

## 2023-01-09 LAB — BASIC METABOLIC PANEL
Anion gap: 7 (ref 5–15)
BUN: 9 mg/dL (ref 6–20)
CO2: 24 mmol/L (ref 22–32)
Calcium: 8.5 mg/dL — ABNORMAL LOW (ref 8.9–10.3)
Chloride: 109 mmol/L (ref 98–111)
Creatinine, Ser: 0.78 mg/dL (ref 0.61–1.24)
GFR, Estimated: >60 mL/min (ref >60)
Glucose, Bld: 100 mg/dL — ABNORMAL HIGH (ref 70–99)
Potassium: 4.1 mmol/L (ref 3.5–5.1)
Sodium: 140 mmol/L (ref 135–145)

## 2023-01-09 LAB — CBC
HCT: 38.2 % — ABNORMAL LOW (ref 39.0–52.0)
Hemoglobin: 13.1 g/dL (ref 13.0–17.0)
MCH: 30.3 pg (ref 26.0–34.0)
MCHC: 34.3 g/dL (ref 30.0–36.0)
MCV: 88.2 fL (ref 80.0–100.0)
Platelets: 229 10*3/uL (ref 150–400)
RBC: 4.33 MIL/uL (ref 4.22–5.81)
RDW: 11.5 % (ref 11.5–15.5)
WBC: 5.6 10*3/uL (ref 4.0–10.5)
nRBC: 0 % (ref 0.0–0.2)

## 2023-01-09 LAB — TSH: TSH: 0.661 u[IU]/mL (ref 0.350–4.500)

## 2023-01-09 LAB — LIPASE, BLOOD: Lipase: 72 U/L — ABNORMAL HIGH (ref 11–51)

## 2023-01-09 LAB — TROPONIN I (HIGH SENSITIVITY)
Troponin I (High Sensitivity): 8 ng/L (ref <18)
Troponin I (High Sensitivity): 7 ng/L (ref <18)

## 2023-01-09 MED ORDER — LORAZEPAM 1 MG PO TABS: 1.0000 mg | ORAL_TABLET | Freq: Three times a day (TID) | ORAL | 0 refills | Status: DC | PRN

## 2023-01-09 MED ORDER — SODIUM CHLORIDE 0.9 % IV BOLUS
1000.0000 mL | Freq: Once | INTRAVENOUS | Status: AC
  Administered 2023-01-09: 1000 mL via INTRAVENOUS

## 2023-01-09 MED ORDER — LORAZEPAM 2 MG/ML IJ SOLN
1.0000 mg | Freq: Once | INTRAMUSCULAR | Status: AC
  Administered 2023-01-09: 1 mg via INTRAVENOUS
  Filled 2023-01-09: qty 1

## 2023-01-09 NOTE — ED Notes (Signed)
Called lab to have them add the BMP to previous collection. Per lab, they will add it

## 2023-01-09 NOTE — ED Triage Notes (Addendum)
Pt arrived POV w/ c/o intermittent palpitations and constant pressure around his heart x past couple days. Also c/o bilateral arms and legs burning that started a couple days ago. Pt reports he went to the smoke shop mytrocia extract (kratom) that he took 2 hrs ago. Pt reports he took kratom to help w/ his s/s and anxiety. Pt is anxious during triage, but in NAD otherwise. A&Ox4.

## 2023-01-09 NOTE — Discharge Instructions (Signed)
Your history, exam and workup today was overall reassuring.  I suspect that the stress and anxiety have contributed to some of the symptoms and palpitations.  I suspect that the supplemental liquid you drank over the last 2 days likely also exacerbated your symptoms.  We have monitored you for over 6 hours with improvement in symptoms and otherwise reassuring workup.  We feel you are safe for discharge home after negative heart enzymes.  We feel you are safe for discharge home.  Please rest and stay hydrated and follow-up with a primary doctor.  Please use the anxiety medicine to help with symptoms if it starts to escalate.  If symptoms were to change or worsen acutely, please return to the nearest emergency department.

## 2023-01-09 NOTE — ED Notes (Signed)
Reviewed discharge instructions with patient. Follow-up care and medications reviewed. Patient  verbalized understanding. Patient A&Ox4, VSS, and ambulatory with steady gait upon discharge.  °

## 2023-01-09 NOTE — ED Notes (Signed)
Pt reports episodes of hyperventilation and stated that he believes this could be anxiety.

## 2023-01-09 NOTE — ED Provider Notes (Signed)
[place] EMERGENCY DEPARTMENT AT [facility] Provider Note   CSN: [id number] Arrival date & time: 01/09/23  4132     History  Chief Complaint  Patient presents with   Palpitations   Bilateral arm and leg burning    Timothy Arnold is a 27 y.o. male.  The history is provided by the patient and medical records. No language interpreter was used.  Palpitations Palpitations quality:  Fast Onset quality:  Unable to specify Duration:  3 days Timing:  Intermittent Progression:  Waxing and waning Chronicity:  Recurrent Relieved by:  Nothing Worsened by:  Nothing Ineffective treatments:  None tried Associated symptoms: chest pain and chest pressure   Associated symptoms: no back pain, no cough, no diaphoresis, no dizziness, no leg pain, no lower extremity edema, no malaise/fatigue, no nausea, no near-syncope, no numbness (tingly), no shortness of breath, no syncope (previous), no vomiting and no weakness   Risk factors: no diabetes mellitus, no heart disease, no hx of atrial fibrillation, no hx of PE and no hx of thyroid disease        Home Medications Prior to Admission medications   Medication Sig Start Date End Date Taking? Authorizing Provider  cloNIDine (CATAPRES) 0.1 MG tablet Take 0.1 mg by mouth.    [provider]  ondansetron (ZOFRAN) 4 MG tablet Take 1 tablet (4 mg total) by mouth every 8 (eight) hours as needed for nausea or vomiting. 08/01/18   Terrilee Files, MD  rOPINIRole (REQUIP) 1 MG tablet TK 1 T PO  QD 10/14/17   [provider]      Allergies    Fluticasone    Review of Systems   Review of Systems  Constitutional:  Negative for chills, diaphoresis, fatigue, fever and malaise/fatigue.  HENT:  Negative for congestion.   Eyes:  Negative for visual disturbance.  Respiratory:  Negative for cough, choking, chest tightness, shortness of breath and wheezing.   Cardiovascular:  Positive for chest pain and palpitations. Negative  for leg swelling, syncope (previous) and near-syncope.  Gastrointestinal:  Negative for abdominal pain, constipation, diarrhea, nausea and vomiting.  Genitourinary:  Negative for dysuria, flank pain and frequency.  Musculoskeletal:  Negative for back pain and neck pain.  Skin:  Negative for rash and wound.  Neurological:  Positive for light-headedness. Negative for dizziness, tremors, speech difficulty, weakness, numbness (tingly) and headaches.  Psychiatric/Behavioral:  Negative for agitation and confusion. The patient is nervous/anxious.   All other systems reviewed and are negative.   Physical Exam Updated Vital Signs BP 136/89 (BP Location: Right Arm)   Pulse 78   Temp 97.9 F (36.6 C) (Oral)   Resp 12   Ht 6\' 3"  (1.905 m)   Wt 59 kg   SpO2 100%   BMI 16.25 kg/m  Physical Exam Vitals and nursing note reviewed.  Constitutional:      General: He is not in acute distress.    Appearance: He is well-developed. He is not ill-appearing, toxic-appearing or diaphoretic.  HENT:     Head: Normocephalic and atraumatic.     Right Ear: External ear normal.     Left Ear: External ear normal.     Nose: Nose normal. No congestion or rhinorrhea.     Mouth/Throat:     Mouth: Mucous membranes are moist.     Pharynx: No oropharyngeal exudate or posterior oropharyngeal erythema.  Eyes:     Extraocular Movements: Extraocular movements intact.     Conjunctiva/sclera: Conjunctivae normal.  Pupils: Pupils are equal, round, and reactive to light.  Cardiovascular:     Rate and Rhythm: Normal rate and regular rhythm.     Heart sounds: No murmur heard. Pulmonary:     Effort: Pulmonary effort is normal. No respiratory distress.     Breath sounds: Normal breath sounds. No stridor. No wheezing, rhonchi or rales.  Chest:     Chest wall: No tenderness.  Abdominal:     General: Abdomen is flat.     Palpations: Abdomen is soft.     Tenderness: There is no abdominal tenderness. There is no  guarding or rebound.  Musculoskeletal:        General: No swelling or tenderness.     Cervical back: Normal range of motion and neck supple. No tenderness.     Right lower leg: No edema.     Left lower leg: No edema.  Skin:    General: Skin is warm and dry.     Capillary Refill: Capillary refill takes less than 2 seconds.     Findings: No erythema or rash.  Neurological:     General: No focal deficit present.     Mental Status: He is alert and oriented to person, place, and time.     Cranial Nerves: No cranial nerve deficit.     Sensory: No sensory deficit.     Motor: No weakness or abnormal muscle tone.     Coordination: Coordination normal.     Deep Tendon Reflexes: Reflexes normal.  Psychiatric:        Mood and Affect: Mood is anxious.        Behavior: Behavior is not agitated or aggressive.     ED Results / Procedures / Treatments   Labs (all labs ordered are listed, but only abnormal results are displayed) Labs Reviewed  CBC - Abnormal; Notable for the following components:      Result Value   HCT 38.2 (*)    All other components within normal limits  HEPATIC FUNCTION PANEL - Abnormal; Notable for the following components:   Total Protein 6.4 (*)    ALT 45 (*)    Total Bilirubin 1.5 (*)    Indirect Bilirubin 1.3 (*)    All other components within normal limits  RAPID URINE DRUG SCREEN, HOSP PERFORMED - Abnormal; Notable for the following components:   Tetrahydrocannabinol POSITIVE (*)    All other components within normal limits  URINALYSIS, ROUTINE W REFLEX MICROSCOPIC - Abnormal; Notable for the following components:   Color, Urine STRAW (*)    All other components within normal limits  LIPASE, BLOOD - Abnormal; Notable for the following components:   Lipase 72 (*)    All other components within normal limits  BASIC METABOLIC PANEL - Abnormal; Notable for the following components:   Glucose, Bld 100 (*)    Calcium 8.5 (*)    All other components within normal  limits  CK  TSH  TROPONIN I (HIGH SENSITIVITY)  TROPONIN I (HIGH SENSITIVITY)    EKG EKG Interpretation  Date/Time:  Saturday January 09 2023 07:17:17 EDT Ventricular Rate:  75 PR Interval:  186 QRS Duration: 116 QT Interval:  382 QTC Calculation: 427 R Axis:   96 Text Interpretation: Sinus rhythm Right atrial enlargement Incomplete right bundle branch block when compared to prior, similar appearance overall. No STEMI Confirmed by Theda Belfast ([id number]) on 01/09/2023 7:19:31 AM  Radiology DG Chest 2 View  Result Date: 01/09/2023 CLINICAL DATA:  Chest pain. EXAM:  CHEST - 2 VIEW COMPARISON:  PA and lateral chest 11/18/2017. FINDINGS: The lungs are clear. Heart size is normal. No pneumothorax or pleural effusion. No acute or focal bony abnormality. IMPRESSION: Negative chest. Electronically Signed   By: Drusilla Kanner M.D.   On: 01/09/2023 08:22    Procedures Procedures    Medications Ordered in ED Medications  sodium chloride 0.9 % bolus 1,000 mL (0 mLs Intravenous Stopped 01/09/23 0930)  LORazepam (ATIVAN) injection 1 mg (1 mg Intravenous Given 01/09/23 0830)    ED Course/ Medical Decision Making/ A&P                             Medical Decision Making Amount and/or Complexity of Data Reviewed Labs: ordered. Radiology: ordered.  Risk Prescription drug management.    Abhay Godbolt is a 27 y.o. male with a past medical history significant for hypertension, previous syncope, anxiety, verbal and pt report of previous bipolar disorder who presents with several days of intermittent palpitations, chest pressure, tingling in all extremities, and anxiety exacerbation.  According to patient, he has had many of the symptoms over the years but all of them seem to have lined up in the last week.  He reports that he has taken a Kratom extract that he drank over the last 2 days which he thought would help calm him down but instead it seems to have made things worse.  He reports she is  having persistent episodes of intermittent racing heart and palpitations and some chest tightness and discomfort.  He denies new syncopal episodes and otherwise denies infectious symptoms with no recent fevers, chills, congestion, cough, nausea, vomiting or urinary changes.  He does say he has had some chronic intermittent constipation diarrhea but this does not seem different.  He denies any recent trauma.  He is denying any current chest pressure or chest pain or palpitations as he reports it has settled down in the emergency department.  He reports it comes and goes.  He does report a strong family history of early heart disease and stroke and he is very worried about this.  He denies current abdominal pain, back pain, or flank pain.  He wants to make sure was not a concerning etiology of his exacerbating symptoms.  On exam, lungs were clear.  Chest nontender.  No murmur.  Good pulses in all extremities.  Normal bowel sounds.  Patient has no focal neurologic deficit with intact sensation, strength, and pulse in extremities.  Normal finger-nose-finger testing.  Symmetric smile.  Clear speech.  Pupils are symmetric and reactive with normal extract movements.  Vital signs reassuring without significant tachycardia, tachypnea, or hypotension.  He is afebrile.  Oxygen saturations are 100% on room air.  Patient resting but appears anxious.  EKG on arrival shows no STEMI or significant arrhythmia.  Clinically I suspect that the patient is having anxiety and stress manifesting as several of these symptoms today.  I suspect that the Kratom extract likely act as a stimulant and made his symptoms worse.  We will give him a dose of Ativan to try to help him relax and will get labs and workup to rule out concerning etiology of chest pain with his heart or lungs.  Will get chest x-ray, cardiac monitor, and labs.  Will give him some fluids as his mucous membranes were dry.  If workup is reassuring, anticipate discharge  with plans for outpatient follow-up with PCP and instructions to  abstain from the use stimulating supplements.   1:23 PM Patient's troponin was negative x 2.  He is feeling much better.  He is resting without significant symptoms.  We feel he is safe for discharge home.  Will give prescription for short course of Ativan to help if he starts escalating with his anxiety and stress and needs to follow-up with her primary doctor for further management.  He understands return precautions and follow-up instructions and was discharged in good condition after reassuring workup.          Final Clinical Impression(s) / ED Diagnoses Final diagnoses:  Palpitations  Anxiety  Chest pain, unspecified type    Rx / DC Orders ED Discharge Orders          Ordered    LORazepam (ATIVAN) 1 MG tablet  3 times daily PRN        01/09/23 1321            Clinical Impression: 1. Palpitations   2. Anxiety   3. Chest pain, unspecified type     Disposition: Discharge  Condition: Good  I have discussed the results, Dx and Tx plan with the pt(& family if present). He/she/they expressed understanding and agree(s) with the plan. Discharge instructions discussed at great length. Strict return precautions discussed and pt &/or family have verbalized understanding of the instructions. No further questions at time of discharge.    New Prescriptions   LORAZEPAM (ATIVAN) 1 MG TABLET    Take 1 tablet (1 mg total) by mouth 3 (three) times daily as needed for anxiety.    Follow Up: [facility] [address] [place] [address] [place] [address] [phone] Schedule an appointment as soon as possible for a visit    [facility] Emergency Department at [facility] [address] [phone] mc [place] [address] [phone]       Macaila Tahir, Canary Brim, MD 01/09/23 1323

## 2023-01-24 ENCOUNTER — Other Ambulatory Visit: Payer: Self-pay

## 2023-01-24 ENCOUNTER — Emergency Department (HOSPITAL_BASED_OUTPATIENT_CLINIC_OR_DEPARTMENT_OTHER)
Admission: EM | Admit: 2023-01-24 | Discharge: 2023-01-24 | Disposition: A | Discharge status: Home / Self Care | Payer: Self-pay | Attending: Emergency Medicine | Admitting: Emergency Medicine

## 2023-01-24 ENCOUNTER — Encounter (HOSPITAL_BASED_OUTPATIENT_CLINIC_OR_DEPARTMENT_OTHER): Payer: Self-pay | Admitting: Emergency Medicine

## 2023-01-24 DIAGNOSIS — R002 Palpitations: Secondary | ICD-10-CM | POA: Insufficient documentation

## 2023-01-24 DIAGNOSIS — F419 Anxiety disorder, unspecified: Secondary | ICD-10-CM | POA: Insufficient documentation

## 2023-01-24 HISTORY — DX: Anxiety disorder, unspecified: F41.9

## 2023-01-24 LAB — T4, FREE: Free T4: 1.02 ng/dL (ref 0.61–1.12)

## 2023-01-24 LAB — CBC
HCT: 40.1 % (ref 39.0–52.0)
Hemoglobin: 13.8 g/dL (ref 13.0–17.0)
MCH: 30.6 pg (ref 26.0–34.0)
MCHC: 34.4 g/dL (ref 30.0–36.0)
MCV: 88.9 fL (ref 80.0–100.0)
Platelets: 244 10*3/uL (ref 150–400)
RBC: 4.51 MIL/uL (ref 4.22–5.81)
RDW: 11.6 % (ref 11.5–15.5)
WBC: 5.4 10*3/uL (ref 4.0–10.5)
nRBC: 0 % (ref 0.0–0.2)

## 2023-01-24 LAB — BASIC METABOLIC PANEL
Anion gap: 7 (ref 5–15)
BUN: 13 mg/dL (ref 6–20)
CO2: 30 mmol/L (ref 22–32)
Calcium: 9.5 mg/dL (ref 8.9–10.3)
Chloride: 103 mmol/L (ref 98–111)
Creatinine, Ser: 0.69 mg/dL (ref 0.61–1.24)
GFR, Estimated: >60 mL/min (ref >60)
Glucose, Bld: 111 mg/dL — ABNORMAL HIGH (ref 70–99)
Potassium: 4 mmol/L (ref 3.5–5.1)
Sodium: 140 mmol/L (ref 135–145)

## 2023-01-24 LAB — TSH: TSH: 0.243 u[IU]/mL — ABNORMAL LOW (ref 0.350–4.500)

## 2023-01-24 MED ORDER — LORAZEPAM 1 MG PO TABS
1.0000 mg | ORAL_TABLET | Freq: Once | ORAL | Status: AC
  Administered 2023-01-24: 1 mg via ORAL
  Filled 2023-01-24: qty 1

## 2023-01-24 MED ORDER — CITALOPRAM HYDROBROMIDE 10 MG PO TABS: 10.0000 mg | ORAL_TABLET | Freq: Every day | ORAL | 0 refills | Status: AC

## 2023-01-24 NOTE — ED Triage Notes (Signed)
Pt does have hx polysubstance hx, has been clean. Self medicated about 2 days ago, pretty sure it was cocaine.

## 2023-01-24 NOTE — ED Provider Notes (Signed)
[place] EMERGENCY DEPARTMENT AT [facility] Provider Note   CSN: [id number] Arrival date & time: 01/24/23  1538     History  Chief complaint: Palpitations heart racing  Timothy Arnold is a 27 y.o. male.  HPI   Patient states he has history of anxiety and panic attacks.  He has not been on medications for a while as he aged out of health insurance.  Patient states the last couple days he has been having trouble with feeling anxious.  He feels like he is coming out of his skin.  He feels jumpy.  Patient also has felt like his heart has been beating really hard and is going to pound out of his chest.  He does admit to cocaine a couple days ago.  Patient states he was trying to self medicate.  Home Medications Prior to Admission medications   Medication Sig Start Date End Date Taking? Authorizing Provider  citalopram (CELEXA) 10 MG tablet Take 1 tablet (10 mg total) by mouth daily. 01/24/23 02/23/23 Yes Linwood Dibbles, MD  Acetaminophen (TYLENOL PO) Take 1 tablet by mouth daily as needed (pain).    [provider]  LORazepam (ATIVAN) 1 MG tablet Take 1 tablet (1 mg total) by mouth 3 (three) times daily as needed for anxiety. 01/09/23   Tegeler, Canary Brim, MD  Pseudoeph-Doxylamine-DM-APAP (NYQUIL PO) Take 30 mLs by mouth at bedtime as needed (sleep).    [provider]      Allergies    Fluticasone    Review of Systems   Review of Systems  Physical Exam Updated Vital Signs BP 130/88 (BP Location: Right Arm)   Pulse 83   Temp 98.4 F (36.9 C) (Oral)   Resp 20   Ht 1.905 m (6\' 3" )   Wt 59 kg   SpO2 100%   BMI 16.25 kg/m  Physical Exam Vitals and nursing note reviewed.  Constitutional:      General: He is not in acute distress.    Appearance: He is well-developed.  HENT:     Head: Normocephalic and atraumatic.     Right Ear: External ear normal.     Left Ear: External ear normal.  Eyes:     General: No scleral icterus.       Right eye: No  discharge.        Left eye: No discharge.     Conjunctiva/sclera: Conjunctivae normal.  Neck:     Trachea: No tracheal deviation.  Cardiovascular:     Rate and Rhythm: Normal rate and regular rhythm.  Pulmonary:     Effort: Pulmonary effort is normal. No respiratory distress.     Breath sounds: Normal breath sounds. No stridor. No wheezing or rales.  Abdominal:     General: Bowel sounds are normal. There is no distension.     Palpations: Abdomen is soft.     Tenderness: There is no abdominal tenderness. There is no guarding or rebound.  Musculoskeletal:        General: No tenderness or deformity.     Cervical back: Neck supple.  Skin:    General: Skin is warm and dry.     Findings: No rash.  Neurological:     General: No focal deficit present.     Mental Status: He is alert.     Cranial Nerves: No cranial nerve deficit, dysarthria or facial asymmetry.     Sensory: No sensory deficit.     Motor: No abnormal muscle tone or seizure activity.  Coordination: Coordination normal.  Psychiatric:        Mood and Affect: Mood is anxious.        Behavior: Behavior is not aggressive or hyperactive.        Thought Content: Thought content is not paranoid or delusional.     ED Results / Procedures / Treatments   Labs (all labs ordered are listed, but only abnormal results are displayed) Labs Reviewed  BASIC METABOLIC PANEL - Abnormal; Notable for the following components:      Result Value   Glucose, Bld 111 (*)    All other components within normal limits  TSH - Abnormal; Notable for the following components:   TSH 0.243 (*)    All other components within normal limits  CBC    EKG EKG Interpretation  Date/Time:  Sunday Jan 24 2023 15:48:54 EDT Ventricular Rate:  71 PR Interval:  162 QRS Duration: 106 QT Interval:  366 QTC Calculation: 398 R Axis:   100 Text Interpretation: Sinus rhythm Right atrial enlargement Consider right ventricular hypertrophy No significant  change since last tracing Confirmed by Junaid Wurzer ([id number]) on 01/24/2023 3:51:29 PM  Radiology No results found.  Procedures Procedures    Medications Ordered in ED Medications  LORazepam (ATIVAN) tablet 1 mg (1 mg Oral Given 01/24/23 1612)    ED Course/ Medical Decision Making/ A&P Clinical Course as of 01/24/23 1719  Sun Jan 24, 2023  1702 CBC and metabolic panel normal. [JK]    Clinical Course User Index [JK] Genee Rann, MD                             Medical Decision Making Differential diagnosis includes but not limited to cardiac dysrhythmia, anxiety disorder, panic attacks, thyroid storm  Problems Addressed: Anxiety: acute illness or injury that poses a threat to life or bodily functions Palpitations: acute illness or injury that poses a threat to life or bodily functions  Amount and/or Complexity of Data Reviewed Labs: ordered. Decision-making details documented in ED Course.  Risk Prescription drug management.   Patient presented to ED with complaints of anxiety.  In the ED the patient is not tachycardic or hypothermic.  Doubt thyroid storm or severe hyperthyroidism.  EKG is normal.  No cardiac dysrhythmia noted.  Some of his symptoms may be related to his cocaine use but it does sound like patient has underlying anxiety disorder.  He previously was on medications.  Will have him start Celexa.  Will give resources for outpatient psychiatry services.  Patient does have plans to follow-up with a primary care doctor this week.  TSH is also slightly decreased.  Would recommend further thyroid evaluation with PCP.        Final Clinical Impression(s) / ED Diagnoses Final diagnoses:  Palpitations  Anxiety    Rx / DC Orders ED Discharge Orders          Ordered    citalopram (CELEXA) 10 MG tablet  Daily        05 /12/24 1717              Linwood Dibbles, MD 01/24/23 1719

## 2023-01-24 NOTE — Discharge Instructions (Addendum)
Take the medication as prescribed to help with your anxiety.  Follow-up with your primary doctor as planned.  Please refer to the resource guide to help find a counselor for your anxiety

## 2023-01-24 NOTE — ED Triage Notes (Signed)
Pt has hx anxiety and panic attacks, was on medication. Stopped about 2 years ago because of insurance. Has been doing okay but last 2 weeks has been feeling anxious/like heart racing.

## 2023-01-25 ENCOUNTER — Emergency Department (HOSPITAL_COMMUNITY)
Admission: EM | Admit: 2023-01-25 | Discharge: 2023-01-26 | Disposition: A | Discharge status: Home / Self Care | Payer: Self-pay | Attending: Emergency Medicine | Admitting: Emergency Medicine

## 2023-01-25 ENCOUNTER — Other Ambulatory Visit: Payer: Self-pay

## 2023-01-25 DIAGNOSIS — F419 Anxiety disorder, unspecified: Secondary | ICD-10-CM

## 2023-01-25 DIAGNOSIS — I10 Essential (primary) hypertension: Secondary | ICD-10-CM | POA: Insufficient documentation

## 2023-01-25 DIAGNOSIS — F410 Panic disorder [episodic paroxysmal anxiety] without agoraphobia: Secondary | ICD-10-CM | POA: Insufficient documentation

## 2023-01-25 NOTE — ED Triage Notes (Signed)
Patient coming to ED for evaluation of having " a panic attack."  States he has hx of panic attacks in the past.  Was on medication for them two years ago.  No reports of SI/HI.  Patient tearful and states "now my arms and legs just hurt from the attack."  Unable to recall trigger of attack.

## 2023-01-26 ENCOUNTER — Emergency Department (HOSPITAL_COMMUNITY)
Admission: EM | Admit: 2023-01-26 | Discharge: 2023-01-26 | Disposition: A | Discharge status: Home / Self Care | Payer: Self-pay | Attending: Emergency Medicine | Admitting: Emergency Medicine

## 2023-01-26 ENCOUNTER — Other Ambulatory Visit: Payer: Self-pay

## 2023-01-26 DIAGNOSIS — R4588 Nonsuicidal self-harm: Secondary | ICD-10-CM

## 2023-01-26 DIAGNOSIS — Z23 Encounter for immunization: Secondary | ICD-10-CM | POA: Insufficient documentation

## 2023-01-26 DIAGNOSIS — S51812A Laceration without foreign body of left forearm, initial encounter: Secondary | ICD-10-CM | POA: Insufficient documentation

## 2023-01-26 DIAGNOSIS — X781XXA Intentional self-harm by knife, initial encounter: Secondary | ICD-10-CM | POA: Insufficient documentation

## 2023-01-26 DIAGNOSIS — F411 Generalized anxiety disorder: Secondary | ICD-10-CM | POA: Diagnosis present

## 2023-01-26 LAB — CBC WITH DIFFERENTIAL/PLATELET
Abs Immature Granulocytes: 0.03 10*3/uL (ref 0.00–0.07)
Basophils Absolute: 0 10*3/uL (ref 0.0–0.1)
Basophils Relative: 1 %
Eosinophils Absolute: 0 10*3/uL (ref 0.0–0.5)
Eosinophils Relative: 0 %
HCT: 40.7 % (ref 39.0–52.0)
Hemoglobin: 14 g/dL (ref 13.0–17.0)
Immature Granulocytes: 1 %
Lymphocytes Relative: 17 %
Lymphs Abs: 1.1 10*3/uL (ref 0.7–4.0)
MCH: 30.8 pg (ref 26.0–34.0)
MCHC: 34.4 g/dL (ref 30.0–36.0)
MCV: 89.5 fL (ref 80.0–100.0)
Monocytes Absolute: 0.7 10*3/uL (ref 0.1–1.0)
Monocytes Relative: 11 %
Neutro Abs: 4.4 10*3/uL (ref 1.7–7.7)
Neutrophils Relative %: 70 %
Platelets: 263 10*3/uL (ref 150–400)
RBC: 4.55 MIL/uL (ref 4.22–5.81)
RDW: 11.7 % (ref 11.5–15.5)
WBC: 6.2 10*3/uL (ref 4.0–10.5)
nRBC: 0 % (ref 0.0–0.2)

## 2023-01-26 LAB — COMPREHENSIVE METABOLIC PANEL
ALT: 38 U/L (ref 0–44)
AST: 27 U/L (ref 15–41)
Albumin: 4.6 g/dL (ref 3.5–5.0)
Alkaline Phosphatase: 48 U/L (ref 38–126)
Anion gap: 6 (ref 5–15)
BUN: 10 mg/dL (ref 6–20)
CO2: 30 mmol/L (ref 22–32)
Calcium: 9.4 mg/dL (ref 8.9–10.3)
Chloride: 102 mmol/L (ref 98–111)
Creatinine, Ser: 0.72 mg/dL (ref 0.61–1.24)
GFR, Estimated: >60 mL/min (ref >60)
Glucose, Bld: 110 mg/dL — ABNORMAL HIGH (ref 70–99)
Potassium: 3.4 mmol/L — ABNORMAL LOW (ref 3.5–5.1)
Sodium: 138 mmol/L (ref 135–145)
Total Bilirubin: 1.8 mg/dL — ABNORMAL HIGH (ref 0.3–1.2)
Total Protein: 7.6 g/dL (ref 6.5–8.1)

## 2023-01-26 LAB — RAPID URINE DRUG SCREEN, HOSP PERFORMED
Amphetamines: NOT DETECTED
Barbiturates: NOT DETECTED
Benzodiazepines: NOT DETECTED
Cocaine: NOT DETECTED
Opiates: NOT DETECTED
Tetrahydrocannabinol: POSITIVE — AB

## 2023-01-26 LAB — ACETAMINOPHEN LEVEL: Acetaminophen (Tylenol), Serum: <10 ug/mL — ABNORMAL LOW (ref 10–30)

## 2023-01-26 LAB — ETHANOL: Alcohol, Ethyl (B): <10 mg/dL (ref <10)

## 2023-01-26 LAB — SALICYLATE LEVEL: Salicylate Lvl: <7 mg/dL — ABNORMAL LOW (ref 7.0–30.0)

## 2023-01-26 MED ORDER — CEPHALEXIN 500 MG PO CAPS: 500.0000 mg | ORAL_CAPSULE | Freq: Three times a day (TID) | ORAL | 0 refills | Status: AC

## 2023-01-26 MED ORDER — LIDOCAINE-EPINEPHRINE (PF) 2 %-1:200000 IJ SOLN
10.0000 mL | Freq: Once | INTRAMUSCULAR | Status: AC
  Administered 2023-01-26: 10 mL
  Filled 2023-01-26: qty 20

## 2023-01-26 MED ORDER — HYDROXYZINE HCL 25 MG PO TABS: 25.0000 mg | ORAL_TABLET | Freq: Three times a day (TID) | ORAL | 0 refills | Status: AC | PRN

## 2023-01-26 MED ORDER — TETANUS-DIPHTH-ACELL PERTUSSIS 5-2.5-18.5 LF-MCG/0.5 IM SUSY
0.5000 mL | PREFILLED_SYRINGE | Freq: Once | INTRAMUSCULAR | Status: AC
  Administered 2023-01-26: 0.5 mL via INTRAMUSCULAR
  Filled 2023-01-26: qty 0.5

## 2023-01-26 MED ORDER — HYDROXYZINE HCL 25 MG PO TABS
25.0000 mg | ORAL_TABLET | Freq: Once | ORAL | Status: AC
  Administered 2023-01-26: 25 mg via ORAL
  Filled 2023-01-26: qty 1

## 2023-01-26 NOTE — Discharge Instructions (Addendum)
Your workup today was overall reassuring.  Your lacerations were repaired with stitches.  The stitches will need to be removed in 7 days.  You can follow-up with your primary care doctor or return to the emergency department to have the stitches removed.  You are also seen by the behavioral health team.  They recommend outpatient follow-up.  Recommend you pick up the previously prescribed medications including Celexa, and hydroxyzine.  To help with your symptoms.  I have given you information for [facility] to establish a primary care provider.  I have sent in an antibiotic to keep your wounds from getting infected.  If you notice worsening redness, drainage from the wounds, or fever without another source please return for reevaluation.

## 2023-01-26 NOTE — ED Triage Notes (Addendum)
EMS reports from home with SI and intentional superficial lacerations to left forearm with a pocket knife. Bleeding controlled. Pt denies SI, just cut himself because he has unbearable burning and tingling in his legs that he wants to go away and thought hurting himself elsewhere would take his mind off it.  BP 120/86 HR 88 RR 16 Sp02 98 RA

## 2023-01-26 NOTE — ED Provider Notes (Signed)
[place] [facility] AT [facility] Provider Note   CSN: [id number] Arrival date & time: 01/26/23  4098     History  Chief Complaint  Patient presents with   Psychiatric Evaluation   Arm Injury    Timothy Arnold is a 27 y.o. male.  27 year old male with past medical history significant for anxiety presents today for concern of self injury to left forearm.  Patient is right-hand dominant.  He states for years he has been having paresthesias to bilateral upper and lower extremities.  He states his dad as well as some of his other family members have similar paresthesias.  He states his dad is on medication that does help control his symptoms.  He states he used to be on Celexa which helped however he aged out of his insurance plan and has not been able to get the medication since then.  He has been seen in this emergency department twice in the past week.  He was prescribed Celexa, however he states he has not been able to pick this up due to his work schedule.  He states the paresthesias wax and wane.  He states currently his paresthesias are mild, however he states he was given a dose of hydroxyzine overnight during the most recent ED visit which helped.  He was prescribed this medication but he has not been able to pick this up.  He states his paresthesias got worse this morning and he wanted the paresthesias.  So he took his pocket knife and lacerated his left forearm and attempt to get the paresthesias to stop.  Thinking back to this morning he states "I did something stupid".  He states "I have no intention of ending my life, I have a good life, and a good job".  Denies any prior suicide attempts.  Denies AVH.  Denies HI.  The history is provided by the patient. No language interpreter was used.       Home Medications Prior to Admission medications   Medication Sig Start Date End Date Taking? Authorizing Provider  Acetaminophen (TYLENOL PO) Take 1 tablet by  mouth daily as needed (pain).    [provider]  citalopram (CELEXA) 10 MG tablet Take 1 tablet (10 mg total) by mouth daily. 01/24/23 02/23/23  Linwood Dibbles, MD  hydrOXYzine (ATARAX) 25 MG tablet Take 1 tablet (25 mg total) by mouth every 8 (eight) hours as needed for anxiety. 01/26/23   Antony Madura, PA-C      Allergies    Fluticasone    Review of Systems   Review of Systems  Skin:  Positive for wound.  Psychiatric/Behavioral:  Positive for self-injury. Negative for hallucinations and suicidal ideas.   All other systems reviewed and are negative.   Physical Exam Updated Vital Signs BP 133/83 (BP Location: Right Arm)   Pulse (!) 108   Temp 98.4 F (36.9 C) (Oral)   Resp 17   SpO2 99%  Physical Exam Vitals and nursing note reviewed.  Constitutional:      General: He is not in acute distress.    Appearance: Normal appearance. He is not ill-appearing.  HENT:     Head: Normocephalic and atraumatic.     Nose: Nose normal.  Eyes:     General: No scleral icterus.    Extraocular Movements: Extraocular movements intact.     Conjunctiva/sclera: Conjunctivae normal.  Cardiovascular:     Rate and Rhythm: Regular rhythm. Tachycardia present.     Pulses: Normal pulses.  Pulmonary:     Effort: Pulmonary effort is normal. No respiratory distress.     Breath sounds: Normal breath sounds. No wheezing or rales.  Abdominal:     General: There is no distension.     Tenderness: There is no abdominal tenderness.  Musculoskeletal:        General: Normal range of motion.     Cervical back: Normal range of motion.  Skin:    General: Skin is warm and dry.  Neurological:     General: No focal deficit present.     Mental Status: He is alert. Mental status is at baseline.     ED Results / Procedures / Treatments   Labs (all labs ordered are listed, but only abnormal results are displayed) Labs Reviewed  ACETAMINOPHEN LEVEL  CBC WITH DIFFERENTIAL/PLATELET  COMPREHENSIVE METABOLIC  PANEL  ETHANOL  RAPID URINE DRUG SCREEN, HOSP PERFORMED  SALICYLATE LEVEL    EKG None  Radiology No results found.  Procedures .Marland KitchenLaceration Repair  Date/Time: 01/26/2023 9:39 AM  Performed by: Marita Kansas, PA-C Authorized by: Marita Kansas, PA-C   Consent:    Consent obtained:  Verbal   Consent given by:  Patient   Risks discussed:  Need for additional repair, infection, retained foreign body, poor cosmetic result and poor wound healing   Alternatives discussed:  No treatment Universal protocol:    Procedure explained and questions answered to patient or proxy's satisfaction: yes     Relevant documents present and verified: yes     Patient identity confirmed:  Verbally with patient and arm band Anesthesia:    Anesthesia method:  Topical application and local infiltration   Local anesthetic:  Lidocaine 2% WITH epi Laceration details:    Location:  Shoulder/arm   Shoulder/arm location:  L lower arm   Length (cm):  3 (3 x3 cm stellate lac) Pre-procedure details:    Preparation:  Patient was prepped and draped in usual sterile fashion Treatment:    Area cleansed with:  Saline and povidone-iodine   Amount of cleaning:  Extensive   Irrigation solution:  Sterile saline   Irrigation volume:  500   Irrigation method:  Tap   Debridement:  None   Undermining:  None Skin repair:    Repair method:  Sutures   Suture size:  5-0   Suture material:  Prolene   Suture technique:  Running locked and simple interrupted   Number of sutures:  9 Approximation:    Approximation:  Close Repair type:    Repair type:  Simple Post-procedure details:    Dressing:  Non-adherent dressing   Procedure completion:  Tolerated well, no immediate complications .Marland KitchenLaceration Repair  Date/Time: 01/26/2023 9:40 AM  Performed by: Marita Kansas, PA-C Authorized by: Marita Kansas, PA-C   Consent:    Consent obtained:  Verbal   Consent given by:  Patient   Risks discussed:  Infection, need for additional  repair, nerve damage, poor wound healing, poor cosmetic result and pain Universal protocol:    Procedure explained and questions answered to patient or proxy's satisfaction: yes     Relevant documents present and verified: yes     Patient identity confirmed:  Verbally with patient Anesthesia:    Anesthesia method:  Local infiltration   Local anesthetic:  Lidocaine 2% WITH epi Laceration details:    Location:  Shoulder/arm   Shoulder/arm location:  L lower arm   Length (cm):  3 Pre-procedure details:    Preparation:  Patient was prepped and draped  in usual sterile fashion Exploration:    Contaminated: no   Treatment:    Area cleansed with:  Povidone-iodine and saline   Amount of cleaning:  Standard   Irrigation solution:  Sterile saline   Irrigation volume:  500   Irrigation method:  Tap   Debridement:  None   Undermining:  None Skin repair:    Repair method:  Sutures   Suture size:  5-0   Suture material:  Prolene   Suture technique:  Running locked   Number of sutures:  5 Approximation:    Approximation:  Close Repair type:    Repair type:  Simple Post-procedure details:    Dressing:  Non-adherent dressing   Procedure completion:  Tolerated well, no immediate complications .Marland KitchenLaceration Repair  Date/Time: 01/26/2023 9:42 AM  Performed by: Marita Kansas, PA-C Authorized by: Marita Kansas, PA-C   Consent:    Consent obtained:  Verbal   Consent given by:  Patient   Risks discussed:  Need for additional repair, infection, poor wound healing, poor cosmetic result, nerve damage and pain Universal protocol:    Procedure explained and questions answered to patient or proxy's satisfaction: yes     Relevant documents present and verified: yes     Patient identity confirmed:  Verbally with patient Anesthesia:    Anesthesia method:  Local infiltration   Local anesthetic:  Lidocaine 2% WITH epi Laceration details:    Location:  Shoulder/arm   Shoulder/arm location:  L lower arm    Length (cm):  2 Pre-procedure details:    Preparation:  Patient was prepped and draped in usual sterile fashion Treatment:    Area cleansed with:  Povidone-iodine and saline   Amount of cleaning:  Standard   Irrigation solution:  Sterile saline   Irrigation volume:  500   Irrigation method:  Tap   Debridement:  None   Undermining:  None Skin repair:    Repair method:  Sutures   Suture size:  5-0   Suture material:  Prolene   Suture technique:  Simple interrupted   Number of sutures:  2 Approximation:    Approximation:  Close Repair type:    Repair type:  Simple Post-procedure details:    Dressing:  Non-adherent dressing   Procedure completion:  Tolerated well, no immediate complications .Marland KitchenLaceration Repair  Date/Time: 01/26/2023 9:44 AM  Performed by: Marita Kansas, PA-C Authorized by: Marita Kansas, PA-C   Consent:    Consent obtained:  Verbal   Consent given by:  Patient   Risks discussed:  Infection, need for additional repair, nerve damage, poor wound healing, poor cosmetic result and pain Universal protocol:    Procedure explained and questions answered to patient or proxy's satisfaction: yes     Relevant documents present and verified: yes     Patient identity confirmed:  Verbally with patient and arm band Anesthesia:    Anesthesia method:  Local infiltration   Local anesthetic:  Lidocaine 2% WITH epi Laceration details:    Location:  Shoulder/arm   Shoulder/arm location:  L lower arm   Length (cm):  2.5 Pre-procedure details:    Preparation:  Patient was prepped and draped in usual sterile fashion Treatment:    Area cleansed with:  Povidone-iodine and saline   Amount of cleaning:  Standard   Irrigation solution:  Sterile saline   Irrigation volume:  500   Irrigation method:  Tap   Debridement:  None   Undermining:  None Skin repair:    Repair method:  Sutures   Suture size:  5-0   Suture material:  Prolene   Suture technique:  Simple interrupted   Number of  sutures:  4 Approximation:    Approximation:  Close Repair type:    Repair type:  Simple Post-procedure details:    Dressing:  Non-adherent dressing   Procedure completion:  Tolerated well, no immediate complications .Marland KitchenLaceration Repair  Date/Time: 01/26/2023 9:45 AM  Performed by: Marita Kansas, PA-C Authorized by: Marita Kansas, PA-C   Consent:    Consent obtained:  Verbal   Consent given by:  Patient   Risks discussed:  Infection, need for additional repair, nerve damage, pain, poor cosmetic result and poor wound healing Universal protocol:    Procedure explained and questions answered to patient or proxy's satisfaction: yes     Relevant documents present and verified: yes     Patient identity confirmed:  Verbally with patient and arm band Anesthesia:    Anesthesia method:  Local infiltration   Local anesthetic:  Lidocaine 2% WITH epi Laceration details:    Location:  Shoulder/arm   Shoulder/arm location:  L lower arm   Length (cm):  1 Pre-procedure details:    Preparation:  Patient was prepped and draped in usual sterile fashion Treatment:    Area cleansed with:  Povidone-iodine and saline   Amount of cleaning:  Standard   Irrigation solution:  Sterile saline   Irrigation volume:  500   Debridement:  None   Undermining:  None Skin repair:    Repair method:  Steri-Strips   Number of Steri-Strips:  1 Approximation:    Approximation:  Close Repair type:    Repair type:  Simple Post-procedure details:    Dressing:  Non-adherent dressing   Procedure completion:  Tolerated well, no immediate complications .Marland KitchenLaceration Repair  Date/Time: 01/26/2023 9:46 AM  Performed by: Marita Kansas, PA-C Authorized by: Marita Kansas, PA-C   Consent:    Consent obtained:  Verbal   Consent given by:  Patient   Risks discussed:  Need for additional repair, nerve damage, infection, pain, poor cosmetic result and poor wound healing Universal protocol:    Procedure explained and questions  answered to patient or proxy's satisfaction: yes     Relevant documents present and verified: yes     Patient identity confirmed:  Verbally with patient Anesthesia:    Anesthesia method:  Local infiltration   Local anesthetic:  Lidocaine 2% WITH epi Laceration details:    Location:  Shoulder/arm   Shoulder/arm location:  L lower arm   Length (cm):  1 Pre-procedure details:    Preparation:  Patient was prepped and draped in usual sterile fashion Treatment:    Area cleansed with:  Povidone-iodine and saline   Amount of cleaning:  Standard   Irrigation solution:  Sterile saline   Irrigation volume:  500   Debridement:  None   Undermining:  None Skin repair:    Repair method:  Steri-Strips   Number of Steri-Strips:  1 Approximation:    Approximation:  Close Repair type:    Repair type:  Simple Post-procedure details:    Dressing:  Non-adherent dressing   Procedure completion:  Tolerated well, no immediate complications     Medications Ordered in ED Medications  Tdap (BOOSTRIX) injection 0.5 mL (has no administration in time range)  lidocaine-EPINEPHrine (XYLOCAINE W/EPI) 2 %-1:200000 (PF) injection 10 mL (has no administration in time range)  hydrOXYzine (ATARAX) tablet 25 mg (has no administration in time range)    ED  Course/ Medical Decision Making/ A&P                             Medical Decision Making Amount and/or Complexity of Data Reviewed Labs: ordered.  Risk Prescription drug management.   27 year old male with past medical history significant for anxiety presents today for concern of self injury to left forearm.  Patient is right-hand dominant.  Reports paresthesias for several years.  He states he is unable to function due to the paresthesias.  He has had 2 visits to the ED in the past week.  He was restarted on Celexa which she has not been able to pick up.  He was also given hydroxyzine which he has not been able to pick up.  Hydroxyzine does help his  symptoms.  He states other members of his family also have similar symptoms.  He states the intense episodes are debilitating for him.  He states he was not suicidal and just wanted to stop himself from having the paresthesias.  Denies HI, or AVH.  Currently without suicidal ideations.  Bleeding controlled.  Please see attached image above for description of the lacerations.  These are fairly superficial.  Neurovascularly intact.  Full function of the left hand intact.  Lacerations were repaired.  See the procedure notes.  Wound care discussed.  Patient is agreeable for psych eval.  Patient medically cleared for psychiatric eval.  Patient evaluated by psychiatry.  They recommend patient is cleared from psychiatric standpoint and he can follow-up outpatient.  Patient is agreeable with plan.  Keflex prescribed.  Discussed he should pick up the previously prescribed medications including hydroxyzine and Celexa.  He is in agreement.  Return precautions discussed.   Final Clinical Impression(s) / ED Diagnoses Final diagnoses:  Nonsuicidal self-injury (HCC)  Laceration of left forearm, initial encounter    Rx / DC Orders ED Discharge Orders          Ordered    cephALEXin (KEFLEX) 500 MG capsule  3 times daily        01/26/23 1200              Marita Kansas, PA-C 01/26/23 1204    Pricilla Loveless, MD 01/29/23 1058

## 2023-01-26 NOTE — ED Provider Notes (Signed)
[place] EMERGENCY DEPARTMENT AT [facility] Provider Note   CSN: [id number] Arrival date & time: 01/25/23  2158     History  Chief Complaint  Patient presents with   Panic Attack    Timothy Arnold is a 27 y.o. male.  27 y/o male with a history of anxiety presents to the emergency department for evaluation of a panic attack.  He reports palpitations and paresthesias in his arms and legs which have continued to wax and wane throughout the day.  He has had similar episodes associated with anxiety.  Has been off of medication for the past 2 years due to lapse in insurance.  He has not yet filled the Celexa prescribed to him 2 days ago.  Denies any stimulant use in the past 48 hours as well as any associated suicidal or homicidal ideations.  The history is provided by the patient. No language interpreter was used.       Home Medications Prior to Admission medications   Medication Sig Start Date End Date Taking? Authorizing Provider  hydrOXYzine (ATARAX) 25 MG tablet Take 1 tablet (25 mg total) by mouth every 8 (eight) hours as needed for anxiety. 01/26/23  Yes Antony Madura, PA-C  Acetaminophen (TYLENOL PO) Take 1 tablet by mouth daily as needed (pain).    [provider]  citalopram (CELEXA) 10 MG tablet Take 1 tablet (10 mg total) by mouth daily. 01/24/23 02/23/23  Linwood Dibbles, MD      Allergies    Fluticasone    Review of Systems   Review of Systems Ten systems reviewed and are negative for acute change, except as noted in the HPI.    Physical Exam Updated Vital Signs BP 124/89 (BP Location: Right Arm)   Pulse 84   Temp 97.8 F (36.6 C) (Oral)   Resp 20   SpO2 95%   Physical Exam Vitals and nursing note reviewed.  Constitutional:      General: He is not in acute distress.    Appearance: He is well-developed. He is not diaphoretic.     Comments: Nontoxic appearing, in NAD  HENT:     Head: Normocephalic and atraumatic.  Eyes:     General: No  scleral icterus.    Conjunctiva/sclera: Conjunctivae normal.  Cardiovascular:     Rate and Rhythm: Normal rate and regular rhythm.     Pulses: Normal pulses.  Pulmonary:     Effort: Pulmonary effort is normal. No respiratory distress.     Comments: Respirations even and unlabored Musculoskeletal:        General: Normal range of motion.     Cervical back: Normal range of motion.  Skin:    General: Skin is warm and dry.     Coloration: Skin is not pale.     Findings: No erythema or rash.  Neurological:     Mental Status: He is alert and oriented to person, place, and time.     Coordination: Coordination normal.  Psychiatric:        Behavior: Behavior normal.     ED Results / Procedures / Treatments   Labs (all labs ordered are listed, but only abnormal results are displayed) Labs Reviewed - No data to display  EKG EKG Interpretation  Date/Time:  Tuesday Jan 26 2023 01:16:06 EDT Ventricular Rate:  71 PR Interval:    QRS Duration: 113 QT Interval:  394 QTC Calculation: 429 R Axis:   90 Text Interpretation: likely sinus Incomplete right bundle branch block  Confirmed by Glynn Octave [phone]) on 01/26/2023 1:36:36 AM  Radiology No results found.  Procedures Procedures    Medications Ordered in ED Medications  hydrOXYzine (ATARAX) tablet 25 mg (25 mg Oral Given 01/26/23 0138)    ED Course/ Medical Decision Making/ A&P Clinical Course as of 01/26/23 0328  Tue Jan 26, 2023  0217 Patient reassessed.  He reports that his anxiety has improved.  He is amenable to discharge.  Has follow-up with behavioral health on Wednesday. [KH]    Clinical Course User Index [KH] Antony Madura, PA-C                             Medical Decision Making Amount and/or Complexity of Data Reviewed ECG/medicine tests: ordered.  Risk Prescription drug management.   This patient presents to the ED for concern of palpitations, this involves an extensive number of treatment options, and  is a complaint that carries with it a high risk of complications and morbidity.  The differential diagnosis includes GAD vs substance abuse vs arrhythmia   Co morbidities that complicate the patient evaluation  Anxiety disorder HTN   Additional history obtained:  External records from outside source obtained and reviewed including negative CXR on 01/09/23   Cardiac Monitoring:  The patient was maintained on a cardiac monitor.  I personally viewed and interpreted the cardiac monitored which showed an underlying rhythm of: sinus rhythm   Medicines ordered and prescription drug management:  I ordered medication including atarax for palpitations/anxiety  Reevaluation of the patient after these medicines showed that the patient improved I have reviewed the patients home medicines and have made adjustments as needed   Test Considered:  Chem-8   Problem List / ED Course:  As above   Reevaluation:  After the interventions noted above, I reevaluated the patient and found that they have :improved   Social Determinants of Health:  Uninsured    Dispostion:  After consideration of the diagnostic results and the patients response to treatment, I feel that the patent would benefit from outpatient behavioral health follow up; has appt scheduled for Wednesday. Given Rx for Celexa which I advised he take. Added Atarax PRN until able to follow up. Return precautions discussed and provided. Patient discharged in stable condition with no unaddressed concerns.          Final Clinical Impression(s) / ED Diagnoses Final diagnoses:  Anxiety disorder, unspecified type    Rx / DC Orders ED Discharge Orders          Ordered    hydrOXYzine (ATARAX) 25 MG tablet  Every 8 hours PRN        01/26/23 0218              Antony Madura, PA-C 01/26/23 0331    Glynn Octave, MD 01/26/23 [phone]

## 2023-01-26 NOTE — Progress Notes (Signed)
This CSW was contacted by Psych NP to provide MH and Substance Abuse resources. Resources provided and attached to AVS. No further TOC needs.

## 2023-01-26 NOTE — Discharge Instructions (Addendum)
Follow-up with your counselor/therapist on Wednesday as scheduled.  You may use Atarax in the interim as prescribed for management of ongoing anxiety.  We also recommend that you start Celexa as previously prescribed.  Return to the ED for new or concerning symptoms.

## 2023-01-26 NOTE — Discharge Summary (Signed)
[facility] Psych ED Discharge  01/26/2023 11:53 AM Timothy Arnold  MRN:  [id number]  Principal Problem: Intentional self-harm by knife [facility]) Discharge Diagnoses: Principal Problem:   Intentional self-harm by knife [facility]) Active Problems:   Anxiety disorder  Clinical Impression:  Final diagnoses:  None   Subjective: Male patient, 27 years old caucasian was brought in by EMS this morning after he called them to report he cut  his left fore arm requiring sutures.  Patient reports that he cut himself because he was trying to relieve tingling sensations on his arms and leg which he has been having for a while.  Patient vehemently denied suicide ideation and denied ever trying to hurt himself.  Patient in the past was diagnosed with Opiate dependence, Depression and anxiety.  He was on medications for a while until he grew out of his insurance.  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  Patient reports three months rehab near [place] for [place] and he was on suboxone for three years for using [place] he was buying from the smoke store.  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  He denies using any Opiate at this time and his UDS is positive for Cannabis.  Patient reports poor sleep and appetite.  Patient has an a room he is renting from somebody and he has a dog.  He repeatedly denied wanting to kill himself. Call to Carollee Herter his aunt is that patient called her and complained of the Tingling on his arm and legs and that he cut himself to relieve the symptoms.  MS Carollee Herter states she does not believe patient is suicidal and was not going to kill himself.  She admits that patient has had issue with drug addiction and she helped him get into Rehab treatment. Patient is willing to do outpatient therapy with outpatient Mental healthcare.  This patient was reviewed with DR Lucianne Muss, Psychiatrist who agreed that patient does not meet criteria for inpatient Psychiatry hospitalization based on the fact that he is not suicidal, he is  employed and has a family member support system.  We will give referral for outpatient at [facility] Mental health facility.  We reviewed safety plan-call 911 or [phone] for suicide ideation or thought or go to any nearby ER.  Go to [facility] health facility for substance abuse care or Mental health care.  Patient is Psychiatrically cleared.  ED Assessment Time Calculation: Start Time: 1107 Stop Time: 1143 Total Time in Minutes (Assessment Completion): 36   Past Psychiatric History: Depression, anxiety and hx of Opiate addiction.  Patient is on Methadone treatment he says.  No outpatient Mental health provider at this time.  Past Medical History:  Past Medical History:  Diagnosis Date   Anxiety    Hypertension    Syncope     Past Surgical History:  Procedure Laterality Date   TONSILLECTOMY     Family History: No family history on file. Family Psychiatric  History: Denies Social History:  Social History   Substance and Sexual Activity  Alcohol Use Not Currently     Social History   Substance and Sexual Activity  Drug Use Not Currently    Social History   Socioeconomic History   Marital status: Single    Spouse name: Not on file   Number of children: Not on file   Years of education: Not on file   Highest education level: Not on file  Occupational History   Not on file  Tobacco Use   Smoking  status: Former    Packs/day: 1    Types: Cigarettes    Quit date: 01/29/2018    Years since quitting: 4.9   Smokeless tobacco: Never  Vaping Use   Vaping Use: Every day  Substance and Sexual Activity   Alcohol use: Not Currently   Drug use: Not Currently   Sexual activity: Not on file  Other Topics Concern   Not on file  Social History Narrative   Not on file   Social Determinants of Health   Financial Resource Strain: Not on file  Food Insecurity: Not on file  Transportation Needs: Not on file  Physical Activity: Not on file  Stress: Not on file   Social Connections: Not on file    Tobacco Cessation:  N/A, patient does not currently use tobacco products  Current Medications: No current facility-administered medications for this encounter.   Current Outpatient Medications  Medication Sig Dispense Refill   Acetaminophen (TYLENOL PO) Take 500 mg by mouth every 6 (six) hours as needed (pain).     citalopram (CELEXA) 10 MG tablet Take 1 tablet (10 mg total) by mouth daily. (Patient not taking: Reported on 01/26/2023) 30 tablet 0   hydrOXYzine (ATARAX) 25 MG tablet Take 1 tablet (25 mg total) by mouth every 8 (eight) hours as needed for anxiety. (Patient not taking: Reported on 01/26/2023) 12 tablet 0   PTA Medications: (Not in a hospital admission)   [place] Scale:  Flowsheet Row ED from [facility]/26/2023 in [facility] at [facility] ED from 01[facility]25/2023 in [facility] Emergency Department at [facility] ED from 01/24/2023 in [facility] Department at [facility]  C-SSRS RISK CATEGORY No Risk No Risk No Risk       Musculoskeletal: Strength & Muscle Tone: within normal limits Gait & Station: normal Patient leans: Front  Psychiatric Specialty Exam: Presentation  General Appearance:  Casual  Eye Contact: Good  Speech: Clear and Coherent; Normal Rate  Speech Volume: Normal  Handedness: Right   Mood and Affect  Mood: Anxious  Affect: Congruent   Thought Process  Thought Processes: Coherent; Goal Directed; Linear  Descriptions of Associations:Intact  Orientation:Full (Time, Place and Person)  Thought Content:Logical  History of Schizophrenia/Schizoaffective disorder:No data recorded Duration of Psychotic Symptoms:No data recorded Hallucinations:Hallucinations: None  Ideas of Reference:None  Suicidal Thoughts:Suicidal Thoughts: No  Homicidal Thoughts:Homicidal Thoughts: No   Sensorium  Memory: Immediate Good; Recent Good; Remote  Good  Judgment: Good  Insight: Good   Executive Functions  Concentration: Good  Attention Span: Good  Recall: Good  Fund of Knowledge: Good  Language: Good   Psychomotor Activity  Psychomotor Activity: Psychomotor Activity: Increased   Assets  Assets: Communication Skills; Housing; Desire for Improvement; Financial Resources/Insurance   Sleep  Sleep: Sleep: Poor    Physical Exam: Physical Exam Vitals and nursing note reviewed.  HENT:     Head: Normocephalic.     Nose: Nose normal.  Cardiovascular:     Rate and Rhythm: Normal rate and regular rhythm.  Pulmonary:     Effort: Pulmonary effort is normal.  Musculoskeletal:        General: Normal range of motion.     Cervical back: Normal range of motion.  Skin:    General: Skin is warm and dry.     Comments: Cut to left arm with sutures applied and wrapped with bandage.  Neurological:     Mental Status: He is alert and oriented to person, place, and time.  Psychiatric:        Attention and Perception: Attention and perception normal.        Mood and Affect: Mood is anxious.        Speech: Speech normal.        Behavior: Behavior normal. Behavior is cooperative.        Thought Content: Thought content normal.        Cognition and Memory: Cognition and memory normal.        Judgment: Judgment is impulsive.    Review of Systems  Constitutional: Negative.   HENT: Negative.    Eyes: Negative.   Respiratory: Negative.    Cardiovascular: Negative.   Gastrointestinal: Negative.   Genitourinary: Negative.   Musculoskeletal: Negative.   Skin:        Cut to left arm with sutures applied and wrapped with bandage.  Neurological: Negative.   Endo/Heme/Allergies: Negative.   Psychiatric/Behavioral:  The patient is nervous/anxious and has insomnia.    Blood pressure 138/84, pulse (!) 59, temperature 98.4 F (36.9 C), temperature source Oral, resp. rate 13, SpO2 97 %. There is no height or weight on  file to calculate BMI.   Demographic Factors:  Male, Adolescent or young adult, Caucasian, and Living alone  Loss Factors: NA  Historical Factors: Impulsivity  Risk Reduction Factors:   employed  Continued Clinical Symptoms:  Depression:   Impulsivity Alcohol/Substance Abuse/Dependencies More than one psychiatric diagnosis  Cognitive Features That Contribute To Risk:  None    Suicide Risk:  Minimal: No identifiable suicidal ideation.  Patients presenting with no risk factors but with morbid ruminations; may be classified as minimal risk based on the severity of the depressive symptoms   Plan Of Care/Follow-up recommendations:  Activity:  as tolerated Diet:  regular  Medical Decision Making: Patient denies previous hx of self harm, denies SI/HI/AVH and no paranoia.  Patient is willing to engage in therapy and mental health treatment.  Referral have been given for [facility] mental health facility.  Patient is Psychiatrically cleared.  Problem 1: Intentional Self harm by Knife  Problem 2: Anxiety disorder  Disposition: Psychiatrically cleared. Earney Navy, NP-PMHNP-BC 01/26/2023, 11:53 AM

## 2023-03-16 ENCOUNTER — Ambulatory Visit (HOSPITAL_COMMUNITY): Payer: Self-pay | Admitting: Licensed Clinical Social Worker
